# Patient Record
Sex: Male | Born: 1985 | Hispanic: No | Marital: Married | Smoking: Current every day smoker
Health system: Southern US, Community
[De-identification: ages and names within clinical notes are randomized; demographics above are authoritative.]

## PROBLEM LIST (undated history)

## (undated) DIAGNOSIS — F419 Anxiety disorder, unspecified: Secondary | ICD-10-CM

## (undated) DIAGNOSIS — I10 Essential (primary) hypertension: Secondary | ICD-10-CM

## (undated) HISTORY — PX: WISDOM TOOTH EXTRACTION: SHX21

## (undated) HISTORY — PX: TONSILLECTOMY: SUR1361

---
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
## 2007-09-30 ENCOUNTER — Emergency Department (HOSPITAL_COMMUNITY): Admission: EM | Admit: 2007-09-30 | Discharge: 2007-09-30 | Payer: Self-pay | Admitting: Emergency Medicine

## 2007-12-03 ENCOUNTER — Emergency Department (HOSPITAL_COMMUNITY): Admission: EM | Admit: 2007-12-03 | Discharge: 2007-12-03 | Payer: Self-pay | Admitting: Family Medicine

## 2008-03-15 ENCOUNTER — Emergency Department (HOSPITAL_COMMUNITY): Admission: EM | Admit: 2008-03-15 | Discharge: 2008-03-15 | Payer: Self-pay | Admitting: Emergency Medicine

## 2008-08-02 ENCOUNTER — Emergency Department (HOSPITAL_COMMUNITY): Admission: EM | Admit: 2008-08-02 | Discharge: 2008-08-02 | Payer: Self-pay | Admitting: Emergency Medicine

## 2008-09-01 ENCOUNTER — Emergency Department (HOSPITAL_COMMUNITY): Admission: EM | Admit: 2008-09-01 | Discharge: 2008-09-01 | Payer: Self-pay | Admitting: Emergency Medicine

## 2008-09-15 ENCOUNTER — Emergency Department (HOSPITAL_COMMUNITY): Admission: EM | Admit: 2008-09-15 | Discharge: 2008-09-15 | Payer: Self-pay | Admitting: Emergency Medicine

## 2008-09-19 ENCOUNTER — Emergency Department (HOSPITAL_COMMUNITY): Admission: EM | Admit: 2008-09-19 | Discharge: 2008-09-19 | Payer: Self-pay | Admitting: Emergency Medicine

## 2008-11-02 ENCOUNTER — Emergency Department (HOSPITAL_COMMUNITY): Admission: EM | Admit: 2008-11-02 | Discharge: 2008-11-02 | Payer: Self-pay | Admitting: Emergency Medicine

## 2008-12-01 ENCOUNTER — Emergency Department (HOSPITAL_COMMUNITY): Admission: EM | Admit: 2008-12-01 | Discharge: 2008-12-01 | Payer: Self-pay | Admitting: Emergency Medicine

## 2009-01-02 ENCOUNTER — Emergency Department (HOSPITAL_COMMUNITY): Admission: EM | Admit: 2009-01-02 | Discharge: 2009-01-02 | Payer: Self-pay | Admitting: Emergency Medicine

## 2009-02-02 ENCOUNTER — Emergency Department (HOSPITAL_COMMUNITY): Admission: EM | Admit: 2009-02-02 | Discharge: 2009-02-02 | Payer: Self-pay | Admitting: Emergency Medicine

## 2009-02-07 ENCOUNTER — Emergency Department (HOSPITAL_COMMUNITY): Admission: EM | Admit: 2009-02-07 | Discharge: 2009-02-07 | Payer: Self-pay | Admitting: Emergency Medicine

## 2009-02-19 ENCOUNTER — Emergency Department (HOSPITAL_COMMUNITY): Admission: EM | Admit: 2009-02-19 | Discharge: 2009-02-20 | Payer: Self-pay | Admitting: Emergency Medicine

## 2009-02-27 ENCOUNTER — Emergency Department (HOSPITAL_COMMUNITY): Admission: EM | Admit: 2009-02-27 | Discharge: 2009-02-27 | Payer: Self-pay | Admitting: Emergency Medicine

## 2009-04-19 ENCOUNTER — Emergency Department (HOSPITAL_COMMUNITY): Admission: EM | Admit: 2009-04-19 | Discharge: 2009-04-19 | Payer: Self-pay | Admitting: Emergency Medicine

## 2009-05-02 ENCOUNTER — Emergency Department (HOSPITAL_COMMUNITY): Admission: EM | Admit: 2009-05-02 | Discharge: 2009-05-02 | Payer: Self-pay | Admitting: Emergency Medicine

## 2009-05-04 ENCOUNTER — Encounter: Payer: Self-pay | Admitting: *Deleted

## 2009-09-10 ENCOUNTER — Emergency Department (HOSPITAL_COMMUNITY): Admission: EM | Admit: 2009-09-10 | Discharge: 2009-09-11 | Payer: Self-pay | Admitting: Emergency Medicine

## 2009-09-15 ENCOUNTER — Emergency Department (HOSPITAL_COMMUNITY): Admission: EM | Admit: 2009-09-15 | Discharge: 2009-09-16 | Payer: Self-pay | Admitting: Emergency Medicine

## 2009-09-20 ENCOUNTER — Emergency Department (HOSPITAL_COMMUNITY): Admission: EM | Admit: 2009-09-20 | Discharge: 2009-09-20 | Payer: Self-pay | Admitting: Emergency Medicine

## 2010-02-18 ENCOUNTER — Encounter: Payer: Self-pay | Admitting: Orthopaedic Surgery

## 2010-02-27 NOTE — Miscellaneous (Signed)
Summary: Do Not Reschedule  Pt missed NP appt.  Per [facility] policy is not allowed to reschedule. Dennison Nancy RN  May 04, 2009 5:04 PM

## 2010-04-13 LAB — URINALYSIS, ROUTINE W REFLEX MICROSCOPIC
Bilirubin Urine: NEGATIVE
Hgb urine dipstick: NEGATIVE
Ketones, ur: NEGATIVE mg/dL
Nitrite: NEGATIVE
pH: 6 (ref 5.0–8.0)

## 2010-04-13 LAB — CBC
HCT: 51.7 % (ref 39.0–52.0)
MCHC: 33.6 g/dL (ref 30.0–36.0)
MCV: 83.6 fL (ref 78.0–100.0)
RDW: 14 % (ref 11.5–15.5)

## 2010-04-13 LAB — BASIC METABOLIC PANEL
BUN: 15 mg/dL (ref 6–23)
Chloride: 111 mEq/L (ref 96–112)
Glucose, Bld: 91 mg/dL (ref 70–99)
Potassium: 4.5 mEq/L (ref 3.5–5.1)

## 2010-04-13 LAB — DIFFERENTIAL
Basophils Absolute: 0 10*3/uL (ref 0.0–0.1)
Basophils Relative: 1 % (ref 0–1)
Eosinophils Relative: 3 % (ref 0–5)
Lymphocytes Relative: 17 % (ref 12–46)
Monocytes Absolute: 0.4 10*3/uL (ref 0.1–1.0)

## 2010-10-31 LAB — POCT I-STAT, CHEM 8
BUN: 13
Chloride: 108
Creatinine, Ser: 1.1
Glucose, Bld: 103 — ABNORMAL HIGH
Hemoglobin: 16.7
Potassium: 3.6

## 2010-11-08 LAB — POCT CARDIAC MARKERS: Operator id: 4531

## 2010-11-08 LAB — BASIC METABOLIC PANEL
BUN: 10
CO2: 23
Chloride: 107
Glucose, Bld: 121 — ABNORMAL HIGH
Potassium: 4.1

## 2010-11-08 LAB — CBC
HCT: 48
Hemoglobin: 16.1
MCHC: 33.7
MCV: 80.9
Platelets: 255
RDW: 12.9

## 2010-11-08 LAB — DIFFERENTIAL
Basophils Absolute: 0.1
Eosinophils Absolute: 0
Eosinophils Relative: 1
Monocytes Absolute: 0.4

## 2011-03-29 ENCOUNTER — Emergency Department (HOSPITAL_COMMUNITY)
Admission: EM | Admit: 2011-03-29 | Discharge: 2011-03-29 | Disposition: A | Discharge status: Home / Self Care | Payer: Self-pay | Attending: Emergency Medicine | Admitting: Emergency Medicine

## 2011-03-29 ENCOUNTER — Encounter (HOSPITAL_COMMUNITY): Payer: Self-pay | Admitting: Emergency Medicine

## 2011-03-29 DIAGNOSIS — K089 Disorder of teeth and supporting structures, unspecified: Secondary | ICD-10-CM | POA: Insufficient documentation

## 2011-03-29 DIAGNOSIS — I10 Essential (primary) hypertension: Secondary | ICD-10-CM | POA: Insufficient documentation

## 2011-03-29 DIAGNOSIS — F17200 Nicotine dependence, unspecified, uncomplicated: Secondary | ICD-10-CM | POA: Insufficient documentation

## 2011-03-29 DIAGNOSIS — K0889 Other specified disorders of teeth and supporting structures: Secondary | ICD-10-CM

## 2011-03-29 HISTORY — DX: Essential (primary) hypertension: I10

## 2011-03-29 MED ORDER — PENICILLIN V POTASSIUM 500 MG PO TABS: 500.0000 mg | ORAL_TABLET | Freq: Four times a day (QID) | ORAL | Status: AC

## 2011-03-29 MED ORDER — OXYCODONE-ACETAMINOPHEN 5-325 MG PO TABS
2.0000 | ORAL_TABLET | Freq: Once | ORAL | Status: AC
  Administered 2011-03-29: 2 via ORAL
  Filled 2011-03-29 (×2): qty 1

## 2011-03-29 MED ORDER — OXYCODONE-ACETAMINOPHEN 5-325 MG PO TABS: 1.0000 | ORAL_TABLET | Freq: Four times a day (QID) | ORAL | Status: AC | PRN

## 2011-03-29 NOTE — Discharge Instructions (Signed)

## 2011-03-29 NOTE — ED Notes (Signed)
Pt denies any injury. Reports woke up this morning with right-sided facial pain

## 2011-03-29 NOTE — ED Provider Notes (Signed)
History     CSN: [id number]  Arrival date & time 03/29/11  1339   First MD Initiated Contact with Patient 03/29/11 1433      Chief Complaint  Patient presents with  . Dental Pain    r/side facial pain  . Anxiety    due to pain    (Consider location/radiation/quality/duration/timing/severity/associated sxs/prior treatment) HPI  Patient presents to the emergency department with a dental complaint. Symptoms began 2 days ago. The patient has tried to alleviate pain with OTC meds.  Pain rated at a 10/10, characterized as throbbing in nature and located back upper molars. Patient denies fever, night sweats, chills, difficulty swallowing or opening mouth, SOB, nuchal rigidity or decreased ROM of neck.  Patient does not have a dentist and requests a resource guide at discharge. Pt is concerned that it may be his wisdom teeth coming in.    Past Medical History  Diagnosis Date  . Hypertension     Past Surgical History  Procedure Date  . Tonsillectomy     Family History  Problem Relation Age of Onset  . Hypertension Mother     History  Substance Use Topics  . Smoking status: Current Everyday Smoker    Types: Cigarettes  . Smokeless tobacco: Not on file  . Alcohol Use: Yes      Review of Systems  All other systems reviewed and are negative.    Allergies  Tramadol and Vicodin  Home Medications   Current Outpatient Rx  Name Route Sig Dispense Refill  . ACETAMINOPHEN 325 MG PO TABS Oral Take 650 mg by mouth every 6 (six) hours as needed. For pain    . OXYCODONE-ACETAMINOPHEN 5-325 MG PO TABS Oral Take 1 tablet by mouth every 6 (six) hours as needed for pain. 15 tablet 0  . PENICILLIN V POTASSIUM 500 MG PO TABS Oral Take 1 tablet (500 mg total) by mouth 4 (four) times daily. 40 tablet 0    BP 151/98  Pulse 88  Temp(Src) 99.4 F (37.4 C) (Oral)  Resp 16  SpO2 99%  Physical Exam  Nursing note and vitals reviewed. Constitutional: He appears well-developed and  well-nourished.  HENT:  Head: Normocephalic and atraumatic. No trismus in the jaw.  Mouth/Throat: Uvula is midline, oropharynx is clear and moist and mucous membranes are normal. No oral lesions. Dental caries present. No dental abscesses, uvula swelling or lacerations.    Eyes: Conjunctivae and EOM are normal. Pupils are equal, round, and reactive to light.  Neck: Normal range of motion. Neck supple.  Cardiovascular: Normal rate and regular rhythm.   Pulmonary/Chest: Effort normal and breath sounds normal.    ED Course  Procedures (including critical care time)  Labs Reviewed - No data to display No results found.   1. Pain, dental       MDM  PT states that he does not have insurance to see a dentist. Pt lookedup on drug data base and has no history of receiving narcotics in the past year. Pt given penicillin and 12 tabs of Percocet. Pt also given resource guide.        Dorthula Matas, PA 03/29/11 1457

## 2011-03-29 NOTE — ED Notes (Signed)
Pt reports pain in r/upper side of mouth x 2 days

## 2011-03-31 NOTE — ED Provider Notes (Signed)
Medical screening examination/treatment/procedure(s) were performed by non-physician practitioner and as supervising physician I was immediately available for consultation/collaboration.   Hurman Horn, MD 03/31/11 [phone]

## 2011-04-03 ENCOUNTER — Emergency Department (HOSPITAL_COMMUNITY)
Admission: EM | Admit: 2011-04-03 | Discharge: 2011-04-03 | Disposition: A | Discharge status: Home / Self Care | Payer: Self-pay | Attending: Emergency Medicine | Admitting: Emergency Medicine

## 2011-04-03 ENCOUNTER — Encounter (HOSPITAL_COMMUNITY): Payer: Self-pay | Admitting: *Deleted

## 2011-04-03 DIAGNOSIS — I10 Essential (primary) hypertension: Secondary | ICD-10-CM | POA: Insufficient documentation

## 2011-04-03 DIAGNOSIS — K029 Dental caries, unspecified: Secondary | ICD-10-CM | POA: Insufficient documentation

## 2011-04-03 DIAGNOSIS — F17200 Nicotine dependence, unspecified, uncomplicated: Secondary | ICD-10-CM | POA: Insufficient documentation

## 2011-04-03 DIAGNOSIS — K0889 Other specified disorders of teeth and supporting structures: Secondary | ICD-10-CM

## 2011-04-03 MED ORDER — OXYCODONE-ACETAMINOPHEN 5-325 MG PO TABS: 1.0000 | ORAL_TABLET | ORAL | Status: AC | PRN

## 2011-04-03 MED ORDER — OXYCODONE-ACETAMINOPHEN 5-325 MG PO TABS
1.0000 | ORAL_TABLET | Freq: Once | ORAL | Status: AC
  Administered 2011-04-03: 1 via ORAL
  Filled 2011-04-03: qty 1

## 2011-04-03 NOTE — ED Provider Notes (Signed)
History     CSN: [id number]  Arrival date & time 04/03/11  1700   First MD Initiated Contact with Patient 04/03/11 1755      Chief Complaint  Patient presents with  . Dental Pain    (Consider location/radiation/quality/duration/timing/severity/associated sxs/prior treatment) HPI History is obtained from the patient. He presents with dental pain. He was seen here for the same on March 1 and given referrals to dentistry to have the teeth extracted. He was also started on penicillin. He states he still has several pills left of this to take. He states that he was not given the correct contact information for the dentist/oral surgeon on call, and was unable to secure followup within 48 hours in order to have financial help with the visit. He presents requesting another referral; states he has had consistent pain since previous visit. He has taken the Percocet which helped, but he is now out. Pain located to back upper right molars and is throbbing in nature. He denies fevers, chills, difficulty swallowing or opening his mouth. He has had no shortness of breath, chest pain, nuchal rigidity or trouble moving his neck. He has not noticed any facial swelling.  Past Medical History  Diagnosis Date  . Hypertension     Past Surgical History  Procedure Date  . Tonsillectomy     Family History  Problem Relation Age of Onset  . Hypertension Mother     History  Substance Use Topics  . Smoking status: Current Everyday Smoker    Types: Cigarettes  . Smokeless tobacco: Not on file  . Alcohol Use: Yes      Review of Systems  Constitutional: Negative for fever, chills and appetite change.  HENT: Positive for dental problem. Negative for sore throat, facial swelling, drooling, trouble swallowing, neck pain, neck stiffness and voice change.   Respiratory: Negative for shortness of breath.   Cardiovascular: Negative for chest pain.  Skin: Negative for color change.  Neurological: Negative  for headaches.    Allergies  Tramadol and Vicodin  Home Medications   Current Outpatient Rx  Name Route Sig Dispense Refill  . ACETAMINOPHEN 325 MG PO TABS Oral Take 650 mg by mouth every 6 (six) hours as needed. For pain    . OXYCODONE-ACETAMINOPHEN 5-325 MG PO TABS Oral Take 1 tablet by mouth every 6 (six) hours as needed for pain. 15 tablet 0  . PENICILLIN V POTASSIUM 500 MG PO TABS Oral Take 1 tablet (500 mg total) by mouth 4 (four) times daily. 40 tablet 0    BP 160/103  Pulse 95  Temp 98 F (36.7 C)  Resp 20  SpO2 100%  Physical Exam  Nursing note and vitals reviewed. Constitutional: He is oriented to person, place, and time. He appears well-developed and well-nourished. No distress.  HENT:  Head: Normocephalic and atraumatic. No trismus in the jaw.  Left Ear: External ear normal.  Mouth/Throat: Oropharynx is clear and moist and mucous membranes are normal. Dental caries present. No dental abscesses or uvula swelling.         No facial swelling or tenderness to palpation. There is no trismus or tenderness to palpation of the floor of mouth. Tender to palpation over right upper molars without obvious broken teeth. Dental caries noted.  Eyes: EOM are normal. Pupils are equal, round, and reactive to light.  Neck: Normal range of motion.       No neck soft tissue swelling noted  Cardiovascular: Normal rate.   Pulmonary/Chest: Effort  normal.  Neurological: He is alert and oriented to person, place, and time.  Skin: Skin is warm and dry. He is not diaphoretic.  Psychiatric: He has a normal mood and affect.    ED Course  Procedures (including critical care time)  Labs Reviewed - No data to display No results found.   1. Pain, dental       MDM  Patient with dental pain since last week, who was unable to secure followup with dentistry. No obvious evidence of abscess today on exam. He is still finishing a course of penicillin. He was given resources to make a  followup appointment with the dental staff on call. I agreed to write him another prescription for analgesics, but stated that it is important that he has his teeth assessed by a dentist. Return precautions discussed.        Grant Fontana, [place] 04/03/11 Windell Moment

## 2011-04-03 NOTE — ED Notes (Signed)
Pt in c/o toothache since last Thursday, seen here for same last week

## 2011-04-03 NOTE — Discharge Instructions (Signed)
It is important that you make a followup with the oral surgeon to have your tooth pulled. Please see the attached contact information for the oral surgeon on call. If you have facial swelling or worsening condition or fever, please return to the ED.  Dental Pain A tooth ache may be caused by cavities (tooth decay). Cavities expose the nerve of the tooth to air and hot or cold temperatures. It may come from an infection or abscess (also called a boil or furuncle) around your tooth. It is also often caused by dental caries (tooth decay). This causes the pain you are having. DIAGNOSIS  Your caregiver can diagnose this problem by exam. TREATMENT   If caused by an infection, it may be treated with medications which kill germs (antibiotics) and pain medications as prescribed by your caregiver. Take medications as directed.   Only take over-the-counter or prescription medicines for pain, discomfort, or fever as directed by your caregiver.   Whether the tooth ache today is caused by infection or dental disease, you should see your dentist as soon as possible for further care.  SEEK MEDICAL CARE IF: The exam and treatment you received today has been provided on an emergency basis only. This is not a substitute for complete medical or dental care. If your problem worsens or new problems (symptoms) appear, and you are unable to meet with your dentist, call or return to this location. SEEK IMMEDIATE MEDICAL CARE IF:   You have a fever.   You develop redness and swelling of your face, jaw, or neck.   You are unable to open your mouth.   You have severe pain uncontrolled by pain medicine.  MAKE SURE YOU:   Understand these instructions.   Will watch your condition.   Will get help right away if you are not doing well or get worse.  Document Released: 01/14/2005 Document Revised: 01/03/2011 Document Reviewed: 09/02/2007 [facility] Patient Information 2012 [place], [place].

## 2011-04-06 NOTE — ED Provider Notes (Signed)
Medical screening examination/treatment/procedure(s) were performed by non-physician practitioner and as supervising physician I was immediately available for consultation/collaboration.  Cyndra Numbers, MD 04/06/11 [phone]

## 2011-07-15 ENCOUNTER — Emergency Department (HOSPITAL_COMMUNITY)
Admission: EM | Admit: 2011-07-15 | Discharge: 2011-07-15 | Disposition: A | Discharge status: Home / Self Care | Payer: Self-pay | Attending: Emergency Medicine | Admitting: Emergency Medicine

## 2011-07-15 ENCOUNTER — Encounter (HOSPITAL_COMMUNITY): Payer: Self-pay | Admitting: *Deleted

## 2011-07-15 DIAGNOSIS — K0889 Other specified disorders of teeth and supporting structures: Secondary | ICD-10-CM

## 2011-07-15 DIAGNOSIS — K089 Disorder of teeth and supporting structures, unspecified: Secondary | ICD-10-CM | POA: Insufficient documentation

## 2011-07-15 DIAGNOSIS — K0381 Cracked tooth: Secondary | ICD-10-CM | POA: Insufficient documentation

## 2011-07-15 DIAGNOSIS — Z8249 Family history of ischemic heart disease and other diseases of the circulatory system: Secondary | ICD-10-CM | POA: Insufficient documentation

## 2011-07-15 DIAGNOSIS — F17200 Nicotine dependence, unspecified, uncomplicated: Secondary | ICD-10-CM | POA: Insufficient documentation

## 2011-07-15 DIAGNOSIS — I10 Essential (primary) hypertension: Secondary | ICD-10-CM | POA: Insufficient documentation

## 2011-07-15 MED ORDER — OXYCODONE-ACETAMINOPHEN 5-325 MG PO TABS: 1.0000 | ORAL_TABLET | ORAL | Status: DC | PRN

## 2011-07-15 MED ORDER — AMOXICILLIN 500 MG PO CAPS
1000.0000 mg | ORAL_CAPSULE | Freq: Once | ORAL | Status: AC
  Administered 2011-07-15: 1000 mg via ORAL
  Filled 2011-07-15: qty 2

## 2011-07-15 MED ORDER — OXYCODONE-ACETAMINOPHEN 5-325 MG PO TABS
1.0000 | ORAL_TABLET | Freq: Once | ORAL | Status: AC
  Administered 2011-07-15: 1 via ORAL
  Filled 2011-07-15: qty 1

## 2011-07-15 MED ORDER — AMOXICILLIN 500 MG PO CAPS: 1000.0000 mg | ORAL_CAPSULE | Freq: Two times a day (BID) | ORAL | Status: DC

## 2011-07-15 NOTE — ED Notes (Signed)
Toothache for 3 days that is causing his head to hurt.

## 2011-07-15 NOTE — Discharge Instructions (Signed)
Called a Patent examiner. Tell them you were referred from the emergency department. The dentist is supposed to take care of the immediate problem.  Dental Pain A tooth ache may be caused by cavities (tooth decay). Cavities expose the nerve of the tooth to air and hot or cold temperatures. It may come from an infection or abscess (also called a boil or furuncle) around your tooth. It is also often caused by dental caries (tooth decay). This causes the pain you are having. DIAGNOSIS  Your caregiver can diagnose this problem by exam. TREATMENT   If caused by an infection, it may be treated with medications which kill germs (antibiotics) and pain medications as prescribed by your caregiver. Take medications as directed.   Only take over-the-counter or prescription medicines for pain, discomfort, or fever as directed by your caregiver.   Whether the tooth ache today is caused by infection or dental disease, you should see your dentist as soon as possible for further care.  SEEK MEDICAL CARE IF: The exam and treatment you received today has been provided on an emergency basis only. This is not a substitute for complete medical or dental care. If your problem worsens or new problems (symptoms) appear, and you are unable to meet with your dentist, call or return to this location. SEEK IMMEDIATE MEDICAL CARE IF:   You have a fever.   You develop redness and swelling of your face, jaw, or neck.   You are unable to open your mouth.   You have severe pain uncontrolled by pain medicine.  MAKE SURE YOU:   Understand these instructions.   Will watch your condition.   Will get help right away if you are not doing well or get worse.  Document Released: 01/14/2005 Document Revised: 01/03/2011 Document Reviewed: 09/02/2007 [facility] Patient Information 2012 [place], [place].  Smoking Hazards Smoking cigarettes is extremely bad for your health. Tobacco smoke has over 200 known poisons in it. There are  over 60 chemicals in tobacco smoke that cause cancer. Some of the chemicals found in cigarette smoke include:   Cyanide.   Benzene.   Formaldehyde.   Methanol (wood alcohol).   Acetylene (fuel used in welding torches).   Ammonia.  Cigarette smoke also contains the poisonous gases nitrogen oxide and carbon monoxide.  Cigarette smokers have an increased risk of many serious medical problems, including:  Lung cancer.   Lung disease (such as pneumonia, bronchitis, and emphysema).   Heart attack and chest pain due to the heart not getting enough oxygen (angina).   Heart disease and peripheral blood vessel disease.   Hypertension.   Stroke.   Oral cancer (cancer of the lip, mouth, or voice box).   Bladder cancer.   Pancreatic cancer.   Cervical cancer.   Pregnancy complications, including premature birth.   Low birthweight babies.   Early menopause.   Lower estrogen level for women.   Infertility.   Facial wrinkles.   Blindness.   Increased risk of broken bones (fractures).   Senile dementia.   Stillbirths and smaller newborn babies, birth defects, and genetic damage to sperm.   Stomach ulcers and internal bleeding.  Children of smokers have an increased risk of the following, because of secondhand smoke exposure:   Sudden infant death syndrome (SIDS).   Respiratory infections.   Lung cancer.   Heart disease.   Ear infections.  Smoking causes approximately:  90% of all lung cancer deaths in men.   80% of all lung cancer deaths  in women.   90% of deaths from chronic obstructive lung disease.  Compared with nonsmokers, smoking increases the risk of:  Coronary heart disease by 2 to 4 times.   Stroke by 2 to 4 times.   Men developing lung cancer by 23 times.   Women developing lung cancer by 13 times.   Dying from chronic obstructive lung diseases by 12 times.  Someone who smokes 2 packs a day loses about 8 years of his or her life. Even  smoking lightly shortens your life expectancy by several years. You can greatly reduce the risk of medical problems for you and your family by stopping now. Smoking is the most preventable cause of death and disease in our society. Within days of quitting smoking, your circulation returns to normal, you decrease the risk of having a heart attack, and your lung capacity improves. There may be some increased phlegm in the first few days after quitting, and it may take months for your lungs to clear up completely. Quitting for 10 years cuts your lung cancer risk to almost that of a nonsmoker. WHY IS SMOKING ADDICTIVE?  Nicotine is the chemical agent in tobacco that is capable of causing addiction or dependence.   When you smoke and inhale, nicotine is absorbed rapidly into the bloodstream through your lungs. Nicotine absorbed through the lungs is capable of creating a powerful addiction. Both inhaled and non-inhaled nicotine may be addictive.   Addiction studies of cigarettes and spit tobacco show that addiction to nicotine occurs mainly during the teen years, when young people begin using tobacco products.  WHAT ARE THE BENEFITS OF QUITTING?  There are many health benefits to quitting smoking.   Likelihood of developing cancer and heart disease decreases. Health improvements are seen almost immediately.   Blood pressure, pulse rate, and breathing patterns start returning to normal soon after quitting.   People who quit may see an improvement in their overall quality of life.  Some people choose to quit all at once. Other options include nicotine replacement products, such as patches, gum, and nasal sprays. Do not use these products without first checking with your caregiver. QUITTING SMOKING It is not easy to quit smoking. Nicotine is addicting, and longtime habits are hard to change. To start, you can write down all your reasons for quitting, tell your family and friends you want to quit, and ask  for their help. Throw your cigarettes away, chew gum or cinnamon sticks, keep your hands busy, and drink extra water or juice. Go for walks and practice deep breathing to relax. Think of all the money you are saving: around $1,000 a year, for the average pack-a-day smoker. Nicotine patches and gum have been shown to improve success at efforts to stop smoking. Zyban (bupropion) is an anti-depressant drug that can be prescribed to reduce nicotine withdrawal symptoms and to suppress the urge to smoke. Smoking is an addiction with both physical and psychological effects. Joining a stop-smoking support group can help you cope with the emotional issues. For more information and advice on programs to stop smoking, call your doctor, your local hospital, or these organizations:  American Lung Association - 1-800-LUNGUSA   American Cancer Society - 1-800-ACS-2345  Document Released: 02/22/2004 Document Revised: 01/03/2011 Document Reviewed: 10/26/2008 [facility] Patient Information 2012 [place], [place].  Amoxicillin capsules or tablets What is this medicine? AMOXICILLIN (a mox i SIL in) is a penicillin antibiotic. It is used to treat certain kinds of bacterial infections. It will not work  for colds, flu, or other viral infections. This medicine may be used for other purposes; ask your health care provider or pharmacist if you have questions. What should I tell my health care provider before I take this medicine? They need to know if you have any of these conditions: -asthma -kidney disease -an unusual or allergic reaction to amoxicillin, other penicillins, cephalosporin antibiotics, other medicines, foods, dyes, or preservatives -pregnant or trying to get pregnant -breast-feeding How should I use this medicine? Take this medicine by mouth with a glass of water. Follow the directions on your prescription label. You may take this medicine with food or on an empty stomach. Take your medicine at regular  intervals. Do not take your medicine more often than directed. Take all of your medicine as directed even if you think your are better. Do not skip doses or stop your medicine early. Talk to your pediatrician regarding the use of this medicine in children. While this drug may be prescribed for selected conditions, precautions do apply. Overdosage: If you think you have taken too much of this medicine contact a poison control center or emergency room at once. NOTE: This medicine is only for you. Do not share this medicine with others. What if I miss a dose? If you miss a dose, take it as soon as you can. If it is almost time for your next dose, take only that dose. Do not take double or extra doses. What may interact with this medicine? -amiloride -birth control pills -chloramphenicol -macrolides -probenecid -sulfonamides -tetracyclines This list may not describe all possible interactions. Give your health care provider a list of all the medicines, herbs, non-prescription drugs, or dietary supplements you use. Also tell them if you smoke, drink alcohol, or use illegal drugs. Some items may interact with your medicine. What should I watch for while using this medicine? Tell your doctor or health care professional if your symptoms do not improve in 2 or 3 days. Take all of the doses of your medicine as directed. Do not skip doses or stop your medicine early. If you are diabetic, you may get a false positive result for sugar in your urine with certain brands of urine tests. Check with your doctor. Do not treat diarrhea with over-the-counter products. Contact your doctor if you have diarrhea that lasts more than 2 days or if the diarrhea is severe and watery. What side effects may I notice from receiving this medicine? Side effects that you should report to your doctor or health care professional as soon as possible: -allergic reactions like skin rash, itching or hives, swelling of the face, lips, or  tongue -breathing problems -dark urine -redness, blistering, peeling or loosening of the skin, including inside the mouth -seizures -severe or watery diarrhea -trouble passing urine or change in the amount of urine -unusual bleeding or bruising -unusually weak or tired -yellowing of the eyes or skin Side effects that usually do not require medical attention (report to your doctor or health care professional if they continue or are bothersome): -dizziness -headache -stomach upset -trouble sleeping This list may not describe all possible side effects. Call your doctor for medical advice about side effects. You may report side effects to FDA at 1-800-FDA-1088. Where should I keep my medicine? Keep out of the reach of children. Store between 68 and 77 degrees F (20 and 25 degrees C). Keep bottle closed tightly. Throw away any unused medicine after the expiration date. NOTE: This sheet is a summary. It may  not cover all possible information. If you have questions about this medicine, talk to your doctor, pharmacist, or health care provider.  2012, [employer]vier/[employer] Standard. (04/07/2007 2:10:59 PM)  Acetaminophen; Oxycodone tablets What is this medicine? ACETAMINOPHEN; OXYCODONE (a set a MEE noe fen; ox i KOE done) is a pain reliever. It is used to treat mild to moderate pain. This medicine may be used for other purposes; ask your health care provider or pharmacist if you have questions. What should I tell my health care provider before I take this medicine? They need to know if you have any of these conditions: -brain tumor -Crohn's disease, inflammatory bowel disease, or ulcerative colitis -drink more than 3 alcohol containing drinks per day -drug abuse or addiction -head injury -heart or circulation problems -kidney disease or problems going to the bathroom -liver disease -lung disease, asthma, or breathing problems -an unusual or allergic reaction to acetaminophen, oxycodone, other  opioid analgesics, other medicines, foods, dyes, or preservatives -pregnant or trying to get pregnant -breast-feeding How should I use this medicine? Take this medicine by mouth with a full glass of water. Follow the directions on the prescription label. Take your medicine at regular intervals. Do not take your medicine more often than directed. Talk to your pediatrician regarding the use of this medicine in children. Special care may be needed. Patients over 22 years old may have a stronger reaction and need a smaller dose. Overdosage: If you think you have taken too much of this medicine contact a poison control center or emergency room at once. NOTE: This medicine is only for you. Do not share this medicine with others. What if I miss a dose? If you miss a dose, take it as soon as you can. If it is almost time for your next dose, take only that dose. Do not take double or extra doses. What may interact with this medicine? -alcohol or medicines that contain alcohol -antihistamines -barbiturates like amobarbital, butalbital, butabarbital, methohexital, pentobarbital, phenobarbital, thiopental, and secobarbital -benztropine -drugs for bladder problems like solifenacin, trospium, oxybutynin, tolterodine, hyoscyamine, and methscopolamine -drugs for breathing problems like ipratropium and tiotropium -drugs for certain stomach or intestine problems like propantheline, homatropine methylbromide, glycopyrrolate, atropine, belladonna, and dicyclomine -general anesthetics like etomidate, ketamine, nitrous oxide, propofol, desflurane, enflurane, halothane, isoflurane, and sevoflurane -medicines for depression, anxiety, or psychotic disturbances -medicines for pain like codeine, morphine, pentazocine, buprenorphine, butorphanol, nalbuphine, tramadol, and propoxyphene -medicines for sleep -muscle relaxants -naltrexone -phenothiazines like perphenazine, thioridazine, chlorpromazine, mesoridazine,  fluphenazine, prochlorperazine, promazine, and trifluoperazine -scopolamine -trihexyphenidyl This list may not describe all possible interactions. Give your health care provider a list of all the medicines, herbs, non-prescription drugs, or dietary supplements you use. Also tell them if you smoke, drink alcohol, or use illegal drugs. Some items may interact with your medicine. What should I watch for while using this medicine? Tell your doctor or health care professional if your pain does not go away, if it gets worse, or if you have new or a different type of pain. You may develop tolerance to the medicine. Tolerance means that you will need a higher dose of the medication for pain relief. Tolerance is normal and is expected if you take this medicine for a long time. Do not suddenly stop taking your medicine because you may develop a severe reaction. Your body becomes used to the medicine. This does NOT mean you are addicted. Addiction is a behavior related to getting and using a drug for a nonmedical reason. If you  have pain, you have a medical reason to take pain medicine. Your doctor will tell you how much medicine to take. If your doctor wants you to stop the medicine, the dose will be slowly lowered over time to avoid any side effects. You may get drowsy or dizzy. Do not drive, use machinery, or do anything that needs mental alertness until you know how this medicine affects you. Do not stand or sit up quickly, especially if you are an older patient. This reduces the risk of dizzy or fainting spells. Alcohol may interfere with the effect of this medicine. Avoid alcoholic drinks. The medicine will cause constipation. Try to have a bowel movement at least every 2 to 3 days. If you do not have a bowel movement for 3 days, call your doctor or health care professional. Do not take Tylenol (acetaminophen) or medicines that have acetaminophen with this medicine. Too much acetaminophen can be very dangerous.  Many nonprescription medicines contain acetaminophen. Always read the labels carefully to avoid taking more acetaminophen. What side effects may I notice from receiving this medicine? Side effects that you should report to your doctor or health care professional as soon as possible: -allergic reactions like skin rash, itching or hives, swelling of the face, lips, or tongue -breathing difficulties, wheezing -confusion -light headedness or fainting spells -severe stomach pain -yellowing of the skin or the whites of the eyes Side effects that usually do not require medical attention (report to your doctor or health care professional if they continue or are bothersome): -dizziness -drowsiness -nausea -vomiting This list may not describe all possible side effects. Call your doctor for medical advice about side effects. You may report side effects to FDA at 1-800-FDA-1088. Where should I keep my medicine? Keep out of the reach of children. This medicine can be abused. Keep your medicine in a safe place to protect it from theft. Do not share this medicine with anyone. Selling or giving away this medicine is dangerous and against the law. Store at room temperature between 20 and 25 degrees C (68 and 77 degrees F). Keep container tightly closed. Protect from light. Flush any unused medicines down the toilet. Do not use the medicine after the expiration date. NOTE: This sheet is a summary. It may not cover all possible information. If you have questions about this medicine, talk to your doctor, pharmacist, or health care provider.  2012, Elsevier/Gold Standard. (12/14/2007 10:01:21 AM)

## 2011-07-15 NOTE — ED Provider Notes (Signed)
History   This chart was scribed for Luis Booze, MD by Luis Wall. The patient was seen in room STRE6/STRE6. Patient's care was started at 1750.     CSN: [id number]  Arrival date & time 07/15/11  1750   First MD Initiated Contact with Patient 07/15/11 1840      Chief Complaint  Patient presents with  . Dental Pain    (Consider location/radiation/quality/duration/timing/severity/associated sxs/prior treatment) Patient is a 26 y.o. male presenting with tooth pain. The history is provided by the patient. No language interpreter was used.  Dental PainThe primary symptoms include mouth pain and headaches. The symptoms began 2 days ago. The symptoms are new. The symptoms occur constantly.  Mouth pain began 24 -48 hours ago. Mouth pain occurs constantly. Affected locations include: teeth. At its highest the mouth pain was at 10/10. The mouth pain is currently at 10/10.  The headache developed gradually. Headache is a new problem.  Medical issues include: smoking.   Luis Wall is a 26 y.o. male who presents to the Emergency Department complaining of bilateral upper dental pain shooting up to his head onset 3 days ago. Patient says the pain is 10/10. Patient says that light worsens the pain. Patient says that hasn't been able to eat because it hurts to chew. Patient denies fever, chills or diaphoresis. Patient with h/o of HTN. Patient with surgical h/o tonsillectomy. Patient says he smokes and drinks occoccasionally. Patient says a pack of cigarettes would last him 5 days.  Patient has no primary care physician.  Past Medical History  Diagnosis Date  . Hypertension     Past Surgical History  Procedure Date  . Tonsillectomy     Family History  Problem Relation Age of Onset  . Hypertension Mother     History  Substance Use Topics  . Smoking status: Current Everyday Smoker    Types: Cigarettes  . Smokeless tobacco: Not on file  . Alcohol Use: Yes      Review of Systems    Neurological: Positive for headaches.   A complete 10 system review of systems was obtained and all systems are negative except as noted in the HPI and PMH.   Allergies  Tramadol and Vicodin  Home Medications   Current Outpatient Rx  Name Route Sig Dispense Refill  . IBUPROFEN 200 MG PO TABS Oral Take 400 mg by mouth every 6 (six) hours as needed. For headaches      BP 143/93  Pulse 80  Temp 98.2 F (36.8 C) (Oral)  Resp 18  SpO2 100%  Physical Exam  Nursing note and vitals reviewed. Constitutional: He is oriented to person, place, and time. He appears well-developed and well-nourished. No distress.  HENT:  Head: Normocephalic and atraumatic.  Mouth/Throat:         Fracture of #16. Tender to percussion. No gingival erythema, palate. No drainage.  Eyes: Conjunctivae and EOM are normal.  Neck: Neck supple.  Cardiovascular: Normal rate.   Pulmonary/Chest: Effort normal.  Musculoskeletal: Normal range of motion.  Lymphadenopathy:    He has no cervical adenopathy.  Neurological: He is alert and oriented to person, place, and time. No sensory deficit.  Skin: Skin is dry.  Psychiatric: He has a normal mood and affect. His behavior is normal.    ED Course  Procedures (including critical care time) DIAGNOSTIC STUDIES: Oxygen Saturation is 100% on room air, normal by my interpretation.    COORDINATION OF CARE: 6:45PM- Patient informed of current plan for treatment  and evaluation and agrees with plan at this time. Informed patient of on-call dentist that he can contact with 48 hours of ED visit. Will provide referral to dentist. Will prescribe antibiotic and pain medication.   1. Pain, dental       MDM  Dental pain. He is complaining of pain across virtually all of his teeth. The only tooth that clearly shows pathology is tooth #16 which has a fracture. Old records are reviewed and he had to ED visits where he was referred to a dentist but failed to follow through  appropriately. He is advised of the need to call the dentist within 2 days and to the to know that he was referred from the ED. Prescriptions are given for amoxicillin and Percocet.      I personally performed the services described in this documentation, which was scribed in my presence. The recorded information has been reviewed and considered.      Luis Booze, MD 07/15/11 2028

## 2011-07-17 ENCOUNTER — Encounter (HOSPITAL_COMMUNITY): Payer: Self-pay | Admitting: Emergency Medicine

## 2011-07-17 ENCOUNTER — Emergency Department (HOSPITAL_COMMUNITY)
Admission: EM | Admit: 2011-07-17 | Discharge: 2011-07-17 | Disposition: A | Discharge status: Home / Self Care | Payer: Self-pay | Attending: Emergency Medicine | Admitting: Emergency Medicine

## 2011-07-17 DIAGNOSIS — K089 Disorder of teeth and supporting structures, unspecified: Secondary | ICD-10-CM | POA: Insufficient documentation

## 2011-07-17 DIAGNOSIS — K0889 Other specified disorders of teeth and supporting structures: Secondary | ICD-10-CM

## 2011-07-17 DIAGNOSIS — I10 Essential (primary) hypertension: Secondary | ICD-10-CM | POA: Insufficient documentation

## 2011-07-17 DIAGNOSIS — F17200 Nicotine dependence, unspecified, uncomplicated: Secondary | ICD-10-CM | POA: Insufficient documentation

## 2011-07-17 NOTE — ED Provider Notes (Signed)
History     CSN: [id number]  Arrival date & time 07/17/11  1256   First MD Initiated Contact with Patient 07/17/11 1406      Chief Complaint  Patient presents with  . Dental Pain    (Consider location/radiation/quality/duration/timing/severity/associated sxs/prior treatment) HPI  26 year old male who was seen in the ED 2 days ago for dental pain and was given a referral to dentist. He has followup with Dr. Ninetta Lights yesterday and had his left upper wisdom tooth removed. Patient states he went home and now is having pain to his right upper wisdom to. Describe pain as a sharp and throbbing sensation that is constant. Pain is nonradiating. He denies fever, hearing changes, neck pain, chest pain, shortness of breath, or rash. Patient states he was also given amoxicillin but that has made his stomach upset. Patient also mentioned that he has ran out of his pain medication. Patient has not follow up with Dr. Ninetta Lights for this new pain.  Past Medical History  Diagnosis Date  . Hypertension     Past Surgical History  Procedure Date  . Tonsillectomy   . Wisdom tooth extraction     Family History  Problem Relation Age of Onset  . Hypertension Mother     History  Substance Use Topics  . Smoking status: Current Everyday Smoker    Types: Cigarettes  . Smokeless tobacco: Not on file  . Alcohol Use: Yes      Review of Systems  Constitutional: Negative for fever.  HENT: Positive for mouth sores. Negative for neck pain.   Skin: Negative for rash.  Neurological: Negative for headaches.    Allergies  Tramadol and Vicodin  Home Medications   Current Outpatient Rx  Name Route Sig Dispense Refill  . ACETAMINOPHEN 500 MG PO TABS Oral Take 1,500 mg by mouth every 6 (six) hours as needed. For pain    . IBUPROFEN 200 MG PO TABS Oral Take 400 mg by mouth every 6 (six) hours as needed. For headaches    . OXYCODONE-ACETAMINOPHEN 5-325 MG PO TABS Oral Take 1 tablet by mouth every 4 (four)  hours as needed for pain. 15 tablet 0    BP 132/86  Pulse 81  Temp 99.2 F (37.3 C) (Oral)  Resp 16  Ht 6\' 2"  (1.88 m)  Wt 280 lb (127.007 kg)  BMI 35.95 kg/m2  SpO2 98%  Physical Exam  Nursing note and vitals reviewed. Constitutional: He is oriented to person, place, and time. He appears well-developed and well-nourished. No distress.  HENT:  Head: Atraumatic.  Mouth/Throat: Uvula is midline, oropharynx is clear and moist and mucous membranes are normal.    Eyes: Conjunctivae are normal.  Neck: Normal range of motion. Neck supple.  Neurological: He is alert and oriented to person, place, and time.  Skin: Skin is warm. No rash noted.  Psychiatric: He has a normal mood and affect.    ED Course  Procedures (including critical care time)  Labs Reviewed - No data to display No results found.   No diagnosis found.    MDM  Recurrent dental pain.  VSS, afebrile.  Was seen by Dr. Ninetta Lights yesterday and has dental extraction.  I recommend return to dentist for further evaluation.  Will not prescribe narcotic pain med today.  Recommend OTC pain meds.          Fayrene Helper, PA-C 07/17/11 1425

## 2011-07-17 NOTE — ED Notes (Signed)
Pt c/o right upper wisdom toothache that started yesterday after having another wisdom tooth removed.  Pt reports taking Amoxicillin and Vicodin at home since Monday.  Pt stopped taking Amoxicillin due to GI upset.

## 2011-07-17 NOTE — Discharge Instructions (Signed)
Please follow up with Dr. Ninetta Lights for reevaluation for your dental pain.  You may take over the counter tylenol for pain.  Avoid taking more than 3grams of acetaminophen per day as it can harm your liver.   Dental Pain A tooth ache may be caused by cavities (tooth decay). Cavities expose the nerve of the tooth to air and hot or cold temperatures. It may come from an infection or abscess (also called a boil or furuncle) around your tooth. It is also often caused by dental caries (tooth decay). This causes the pain you are having. DIAGNOSIS  Your caregiver can diagnose this problem by exam. TREATMENT   If caused by an infection, it may be treated with medications which kill germs (antibiotics) and pain medications as prescribed by your caregiver. Take medications as directed.   Only take over-the-counter or prescription medicines for pain, discomfort, or fever as directed by your caregiver.   Whether the tooth ache today is caused by infection or dental disease, you should see your dentist as soon as possible for further care.  SEEK MEDICAL CARE IF: The exam and treatment you received today has been provided on an emergency basis only. This is not a substitute for complete medical or dental care. If your problem worsens or new problems (symptoms) appear, and you are unable to meet with your dentist, call or return to this location. SEEK IMMEDIATE MEDICAL CARE IF:   You have a fever.   You develop redness and swelling of your face, jaw, or neck.   You are unable to open your mouth.   You have severe pain uncontrolled by pain medicine.  MAKE SURE YOU:   Understand these instructions.   Will watch your condition.   Will get help right away if you are not doing well or get worse.  Document Released: 01/14/2005 Document Revised: 01/03/2011 Document Reviewed: 09/02/2007 [facility] Patient Information 2012 [place], [place].

## 2011-07-18 NOTE — ED Provider Notes (Signed)
Medical screening examination/treatment/procedure(s) were performed by non-physician practitioner and as supervising physician I was immediately available for consultation/collaboration.   Rayaan Garguilo M Berlynn Warsame, MD 07/18/11 1333 

## 2011-07-22 ENCOUNTER — Encounter (HOSPITAL_COMMUNITY): Payer: Self-pay | Admitting: *Deleted

## 2011-07-22 ENCOUNTER — Emergency Department (HOSPITAL_COMMUNITY)
Admission: EM | Admit: 2011-07-22 | Discharge: 2011-07-22 | Disposition: A | Discharge status: Home / Self Care | Payer: Self-pay | Attending: Emergency Medicine | Admitting: Emergency Medicine

## 2011-07-22 DIAGNOSIS — Z885 Allergy status to narcotic agent status: Secondary | ICD-10-CM | POA: Insufficient documentation

## 2011-07-22 DIAGNOSIS — K089 Disorder of teeth and supporting structures, unspecified: Secondary | ICD-10-CM | POA: Insufficient documentation

## 2011-07-22 DIAGNOSIS — I10 Essential (primary) hypertension: Secondary | ICD-10-CM | POA: Insufficient documentation

## 2011-07-22 DIAGNOSIS — K0889 Other specified disorders of teeth and supporting structures: Secondary | ICD-10-CM

## 2011-07-22 DIAGNOSIS — F1010 Alcohol abuse, uncomplicated: Secondary | ICD-10-CM | POA: Insufficient documentation

## 2011-07-22 DIAGNOSIS — F17200 Nicotine dependence, unspecified, uncomplicated: Secondary | ICD-10-CM | POA: Insufficient documentation

## 2011-07-22 MED ORDER — IBUPROFEN 600 MG PO TABS: 600.0000 mg | ORAL_TABLET | Freq: Four times a day (QID) | ORAL | Status: AC | PRN

## 2011-07-22 MED ORDER — OXYCODONE-ACETAMINOPHEN 5-325 MG PO TABS
1.0000 | ORAL_TABLET | Freq: Once | ORAL | Status: AC
  Administered 2011-07-22: 1 via ORAL
  Filled 2011-07-22: qty 1

## 2011-07-22 MED ORDER — PENICILLIN V POTASSIUM 500 MG PO TABS: 500.0000 mg | ORAL_TABLET | Freq: Four times a day (QID) | ORAL | Status: AC

## 2011-07-22 MED ORDER — OXYCODONE-ACETAMINOPHEN 5-325 MG PO TABS: 2.0000 | ORAL_TABLET | ORAL | Status: AC | PRN

## 2011-07-22 NOTE — Discharge Instructions (Signed)
Dental Pain °A tooth ache may be caused by cavities (tooth decay). Cavities expose the nerve of the tooth to air and hot or cold temperatures. It may come from an infection or abscess (also called a boil or furuncle) around your tooth. It is also often caused by dental caries (tooth decay). This causes the pain you are having. °DIAGNOSIS  °Your caregiver can diagnose this problem by exam. °TREATMENT  °· If caused by an infection, it may be treated with medications which kill germs (antibiotics) and pain medications as prescribed by your caregiver. Take medications as directed.  °· Only take over-the-counter or prescription medicines for pain, discomfort, or fever as directed by your caregiver.  °· Whether the tooth ache today is caused by infection or dental disease, you should see your dentist as soon as possible for further care.  °SEEK MEDICAL CARE IF: °The exam and treatment you received today has been provided on an emergency basis only. This is not a substitute for complete medical or dental care. If your problem worsens or new problems (symptoms) appear, and you are unable to meet with your dentist, call or return to this location. °SEEK IMMEDIATE MEDICAL CARE IF:  °· You have a fever.  °· You develop redness and swelling of your face, jaw, or neck.  °· You are unable to open your mouth.  °· You have severe pain uncontrolled by pain medicine.  °MAKE SURE YOU:  °· Understand these instructions.  °· Will watch your condition.  °· Will get help right away if you are not doing well or get worse.  °Document Released: 01/14/2005 Document Revised: 01/03/2011 Document Reviewed: 09/02/2007 °[employer]® Patient Information ©2012 [employer]. ° °RESOURCE GUIDE ° °Dental Problems ° °Patients with Medicaid: °[facility]                     [facility] °[address]                                           [address] °Phone:  [phone]                                                    Phone:  510-2600 ° °If unable to pay or uninsured, contact:  [facility] or [facility] to become qualified for the adult dental clinic. ° °Chronic Pain Problems °Contact [facility]  [phone] °Patients need to be referred by their primary care doctor. ° °Insufficient Money for Medicine °Contact [employer]:  call "211" or [facility] 271-5999. ° °No Primary Care Doctor °Call [facility]  832-8000 °Other agencies that provide inexpensive medical care °   [facility]  832-8035 °   [facility]  832-7272 °   [facility]  [phone] °   [facility]  [phone] °   [facility]  [phone] °   [facility]  [phone] ° °Psychological Services °[facility]  [phone] °[facility]  [phone] °[facility]   800 853-5163 (emergency services 641-4993) ° °Abuse/Neglect °[place] (336) 641-3795 °[place] [facility] Hotline 800-378-5315 (After Hours) ° °Emergency   Shelter °[facility] (336) 271-5985 ° °Maternity Homes °[facility] (336) 275-9566 °[facility] (704) 372-4663 ° °MRSA Hotline #:   832-7006 ° ° ° °[place] [facility] ° °[facility]  [employer]                           [facility] °[address] [place]                     [address]         [address]  °[place]                                               [place]                              [place] °Phone:  349-3220                                  Phone:  [phone]                   Phone:  342-8140 ° °[facility] °Phone:  342-8316 ° °[place] [facility] Hotline °(336) 342-1394 °(336) 342-3537 (After Hours) ° °

## 2011-07-22 NOTE — ED Notes (Signed)
To ED for eval of continued toothache. States he had a tooth pulled last week and now the tooth beside the extracted tooth is hurting.

## 2011-07-22 NOTE — ED Provider Notes (Signed)
History   This chart was scribed for Forbes Cellar, MD by Toya Smothers. The patient was seen in room TR09C/TR09C. Patient's care was started at 1658.  CSN: [id number]  Arrival date & time 07/22/11  1658   First MD Initiated Contact with Patient 07/22/11 1921      Chief Complaint  Patient presents with  . Dental Pain    Patient is a 26 y.o. male presenting with tooth pain. The history is provided by the patient. No language interpreter was used.  Dental Pain   Luis Wall is a 26 y.o. male who presents to the Emergency Department complaining of gradual onset moderate sever constant tooth pain. Pt reports that he had a tooth removed, and that he believes that they may have aggravated the adjacent tooth. After the procedure Pt was also give Vicodin, of which he is allergic to and has not been taking. Pain 10/10 at this time. Denies fever/chills.  Pt list a medical h/o hypertension. Pt is a current everyday smoker and admits the use of alcohol.  Pt lists DDS as Dr. Ninetta Lights pulled tooth on [address]  Past Medical History  Diagnosis Date  . Hypertension     Past Surgical History  Procedure Date  . Tonsillectomy   . Wisdom tooth extraction     Family History  Problem Relation Age of Onset  . Hypertension Mother     History  Substance Use Topics  . Smoking status: Current Everyday Smoker    Types: Cigarettes  . Smokeless tobacco: Not on file  . Alcohol Use: Yes   Review of Systems  All other systems reviewed and are negative.   except as noted HPI   Allergies  Tramadol and Vicodin  Home Medications   Current Outpatient Rx  Name Route Sig Dispense Refill  . ACETAMINOPHEN 500 MG PO TABS Oral Take 1,500 mg by mouth every 6 (six) hours as needed. For pain    . IBUPROFEN 200 MG PO TABS Oral Take 400 mg by mouth every 6 (six) hours as needed. For headaches    . IBUPROFEN 600 MG PO TABS Oral Take 1 tablet (600 mg total) by mouth every 6 (six) hours as needed for  pain. 30 tablet 0  . OXYCODONE-ACETAMINOPHEN 5-325 MG PO TABS Oral Take 2 tablets by mouth every 4 (four) hours as needed for pain. 6 tablet 0  . PENICILLIN V POTASSIUM 500 MG PO TABS Oral Take 1 tablet (500 mg total) by mouth 4 (four) times daily. 40 tablet 0    BP 131/90  Pulse 81  Temp 98.5 F (36.9 C) (Oral)  Resp 17  SpO2 98%  Physical Exam  Nursing note and vitals reviewed. Constitutional: He is oriented to person, place, and time. He appears well-developed and well-nourished. No distress.  HENT:  Head: Normocephalic and atraumatic.  Mouth/Throat: Oropharynx is clear and moist.         Tooth 15 tenderness to percussion, no obvious dental decay. Tooth 16 is the sight of extraction.  Eyes: Conjunctivae and EOM are normal. Pupils are equal, round, and reactive to light.  Neck: Neck supple. No tracheal deviation present.  Cardiovascular: Normal rate, regular rhythm, normal heart sounds and intact distal pulses.  Exam reveals no gallop and no friction rub.   No murmur heard. Pulmonary/Chest: Effort normal. No respiratory distress. He has no wheezes. He has no rales.  Abdominal: Soft. Bowel sounds are normal. He exhibits no distension and no mass. There is no tenderness. There  is no rebound and no guarding.  Musculoskeletal: Normal range of motion. He exhibits no edema and no tenderness.  Neurological: He is alert and oriented to person, place, and time. No sensory deficit. Coordination normal.  Skin: Skin is warm and dry.  Psychiatric: He has a normal mood and affect. His behavior is normal.    ED Course  Procedures (including critical care time) DIAGNOSTIC STUDIES: Oxygen Saturation is 98% on room air, normal by my interpretation.    COORDINATION OF CARE: 28- evaluated state of pt's current illness.   Labs Reviewed - No data to display No results found.   1. Pain, dental     MDM  Dental pain possible apical abscess. Recent tooth extraction with site unremarkable.  PCN VK, percocet. Dental f/u.  I personally performed the services described in this documentation, which was scribed in my presence. The recorded information has been reviewed and considered.    Forbes Cellar, MD 07/22/11 2004

## 2011-07-22 NOTE — ED Notes (Signed)
Pt. Has swelling and pain to the left side of his face.  Pt. Has c/o pain and HA at this time.

## 2011-09-27 ENCOUNTER — Emergency Department (HOSPITAL_COMMUNITY)
Admission: EM | Admit: 2011-09-27 | Discharge: 2011-09-27 | Disposition: A | Discharge status: Home / Self Care | Payer: Self-pay | Attending: Emergency Medicine | Admitting: Emergency Medicine

## 2011-09-27 ENCOUNTER — Encounter (HOSPITAL_COMMUNITY): Payer: Self-pay | Admitting: Emergency Medicine

## 2011-09-27 DIAGNOSIS — M545 Low back pain, unspecified: Secondary | ICD-10-CM | POA: Insufficient documentation

## 2011-09-27 DIAGNOSIS — S39012A Strain of muscle, fascia and tendon of lower back, initial encounter: Secondary | ICD-10-CM

## 2011-09-27 DIAGNOSIS — X500XXA Overexertion from strenuous movement or load, initial encounter: Secondary | ICD-10-CM | POA: Insufficient documentation

## 2011-09-27 DIAGNOSIS — F17200 Nicotine dependence, unspecified, uncomplicated: Secondary | ICD-10-CM | POA: Insufficient documentation

## 2011-09-27 DIAGNOSIS — M25569 Pain in unspecified knee: Secondary | ICD-10-CM | POA: Insufficient documentation

## 2011-09-27 DIAGNOSIS — IMO0002 Reserved for concepts with insufficient information to code with codable children: Secondary | ICD-10-CM | POA: Insufficient documentation

## 2011-09-27 DIAGNOSIS — I10 Essential (primary) hypertension: Secondary | ICD-10-CM | POA: Insufficient documentation

## 2011-09-27 MED ORDER — HYDROCODONE-ACETAMINOPHEN 5-325 MG PO TABS: 1.0000 | ORAL_TABLET | Freq: Once | ORAL | Status: DC

## 2011-09-27 MED ORDER — OXYCODONE-ACETAMINOPHEN 5-325 MG PO TABS
1.0000 | ORAL_TABLET | Freq: Once | ORAL | Status: AC
  Administered 2011-09-27: 1 via ORAL
  Filled 2011-09-27: qty 1

## 2011-09-27 MED ORDER — CYCLOBENZAPRINE HCL 10 MG PO TABS: 10.0000 mg | ORAL_TABLET | Freq: Three times a day (TID) | ORAL | Status: AC | PRN

## 2011-09-27 MED ORDER — NAPROXEN 500 MG PO TABS: 500.0000 mg | ORAL_TABLET | Freq: Two times a day (BID) | ORAL | Status: DC

## 2011-09-27 MED ORDER — KETOROLAC TROMETHAMINE 60 MG/2ML IM SOLN
60.0000 mg | Freq: Once | INTRAMUSCULAR | Status: AC
  Administered 2011-09-27: 60 mg via INTRAMUSCULAR
  Filled 2011-09-27: qty 2

## 2011-09-27 NOTE — ED Provider Notes (Signed)
History     CSN: [id number]  Arrival date & time 09/27/11  1416   First MD Initiated Contact with Patient 09/27/11 1739      Chief Complaint  Patient presents with  . left knee pain     (Consider location/radiation/quality/duration/timing/severity/associated sxs/prior treatment) HPI Comments: Patient presents emergency department with chief complaint of left knee pain.  Symptoms is recurrent with previous injury and evaluation approximately one year ago by [place] [facility].  He was told to get an MRI at that time unable to afford the study.  The current episode began on Wednesday while bowling.  Patient denies feeling a pop or catching his knee but reports a twisting sensation that has caused radiating pain up to his back.  Patient states that he's here for pain management and referral to orthopedic doctor.  He denies any numbness, weakness or tingling of extremity.  Associated symptoms includes mild left back pain but he denies any inability to ambulate, saddle paresthesias, loss control of bowel or bladder, IV drug use, cancer, or fever, night sweats or chills.  No other complaints at this time.  Patient is a 26 y.o. male presenting with knee pain. The history is provided by the patient.  Knee Pain This is a recurrent problem.    Past Medical History  Diagnosis Date  . Hypertension     Past Surgical History  Procedure Date  . Tonsillectomy   . Wisdom tooth extraction     Family History  Problem Relation Age of Onset  . Hypertension Mother     History  Substance Use Topics  . Smoking status: Current Everyday Smoker    Types: Cigarettes  . Smokeless tobacco: Not on file  . Alcohol Use: Yes      Review of Systems  All other systems reviewed and are negative.    Allergies  Tramadol and Vicodin  Home Medications   Current Outpatient Rx  Name Route Sig Dispense Refill  . IBUPROFEN 200 MG PO TABS Oral Take 400 mg by mouth every 6 (six) hours as needed. For  Pain      BP 124/85  Pulse 90  Temp 98.5 F (36.9 C) (Oral)  Resp 20  SpO2 100%  Physical Exam  Nursing note and vitals reviewed. Constitutional: He is oriented to person, place, and time. He appears well-developed and well-nourished. No distress.  HENT:  Head: Normocephalic and atraumatic.  Eyes: Conjunctivae and EOM are normal.  Neck: Normal range of motion. Neck supple.  Cardiovascular:       Intact distal pulses, capillary refill < 3 seconds  Pulmonary/Chest: Effort normal.  Musculoskeletal: Normal range of motion.       Left knee: He exhibits normal range of motion, no swelling, no effusion, no ecchymosis, no deformity and no erythema. tenderness found.       Lumbar back: He exhibits tenderness, pain and spasm. He exhibits normal range of motion, no bony tenderness, no swelling and no edema.       Back:       Legs:      All other extremities with normal ROM  Neurological: He is alert and oriented to person, place, and time.       Strength 5/5 bilaterally, intact distal sensation, normal gait   Skin: Skin is warm and dry. No rash noted. He is not diaphoretic.       Skin intact, no tenting  Psychiatric: He has a normal mood and affect. His behavior is normal.  ED Course  Procedures (including critical care time)  Labs Reviewed - No data to display No results found.   No diagnosis found.    MDM  Muscle strain, knee pain  Patient with back & knee pain.  No neurological deficits and normal neuro exam.  Patient can walk but states is painful.  No loss of bowel or bladder control.  No concern for cauda equina.  No fever, night sweats, weight loss, h/o cancer, IVDU.  RICE protocol and pain medicine indicated and discussed with patient.          Jaci Carrel, [place] 09/27/11 1830

## 2011-09-27 NOTE — ED Notes (Signed)
Pt complaining of left knee pain and states he bowls on Wednesdays and puts "alot of pressure on that knee." Pt states, this Wednesday the pain got really bad and started to radiate up his left leg into his lower back.

## 2011-09-28 NOTE — ED Provider Notes (Signed)
Medical screening examination/treatment/procedure(s) were performed by non-physician practitioner and as supervising physician I was immediately available for consultation/collaboration.  Dorrance Sellick, MD 09/28/11 0010 

## 2011-09-30 ENCOUNTER — Emergency Department (HOSPITAL_COMMUNITY)
Admission: EM | Admit: 2011-09-30 | Discharge: 2011-09-30 | Disposition: A | Discharge status: Home / Self Care | Payer: Self-pay | Attending: Emergency Medicine | Admitting: Emergency Medicine

## 2011-09-30 ENCOUNTER — Encounter (HOSPITAL_COMMUNITY): Payer: Self-pay | Admitting: *Deleted

## 2011-09-30 DIAGNOSIS — Z888 Allergy status to other drugs, medicaments and biological substances status: Secondary | ICD-10-CM | POA: Insufficient documentation

## 2011-09-30 DIAGNOSIS — F17200 Nicotine dependence, unspecified, uncomplicated: Secondary | ICD-10-CM | POA: Insufficient documentation

## 2011-09-30 DIAGNOSIS — Z885 Allergy status to narcotic agent status: Secondary | ICD-10-CM | POA: Insufficient documentation

## 2011-09-30 DIAGNOSIS — Z8249 Family history of ischemic heart disease and other diseases of the circulatory system: Secondary | ICD-10-CM | POA: Insufficient documentation

## 2011-09-30 DIAGNOSIS — K089 Disorder of teeth and supporting structures, unspecified: Secondary | ICD-10-CM | POA: Insufficient documentation

## 2011-09-30 DIAGNOSIS — R51 Headache: Secondary | ICD-10-CM | POA: Insufficient documentation

## 2011-09-30 DIAGNOSIS — G8929 Other chronic pain: Secondary | ICD-10-CM | POA: Insufficient documentation

## 2011-09-30 DIAGNOSIS — I10 Essential (primary) hypertension: Secondary | ICD-10-CM | POA: Insufficient documentation

## 2011-09-30 MED ORDER — MELOXICAM 7.5 MG PO TABS: 15.0000 mg | ORAL_TABLET | Freq: Every day | ORAL | Status: DC

## 2011-09-30 MED ORDER — PREDNISONE 20 MG PO TABS
10.0000 mg | ORAL_TABLET | Freq: Every day | ORAL | Status: DC
  Administered 2011-09-30: 10 mg via ORAL
  Filled 2011-09-30: qty 1

## 2011-09-30 MED ORDER — DIPHENHYDRAMINE HCL 25 MG PO CAPS
25.0000 mg | ORAL_CAPSULE | Freq: Four times a day (QID) | ORAL | Status: DC | PRN
  Administered 2011-09-30: 25 mg via ORAL
  Filled 2011-09-30: qty 1

## 2011-09-30 MED ORDER — METOCLOPRAMIDE HCL 10 MG PO TABS
10.0000 mg | ORAL_TABLET | Freq: Once | ORAL | Status: AC
  Administered 2011-09-30: 10 mg via ORAL
  Filled 2011-09-30: qty 1

## 2011-09-30 NOTE — ED Notes (Addendum)
To ED for eval of HA for the past cple of days. Photophobia. Nausea.

## 2011-10-06 NOTE — ED Provider Notes (Signed)
History     CSN: [id number]  Arrival date & time 09/30/11  1757   First MD Initiated Contact with Patient 09/30/11 2138      Chief Complaint  Patient presents with  . Headache    (Consider location/radiation/quality/duration/timing/severity/associated sxs/prior treatment) HPI Comments: Luis Wall 26 y.o. male   The chief complaint is: Patient presents with:   Headache    26 y/o patient who has been seen frequently in the ED for pain complaints presents today with cc of headache emanating form his wisdom teeth. He states that he has 3 impacted wisdom teeth that are giving him a headache. Onset 2 days. Location bilateral frontotemporal. Constant, dull, aggravated by light, not alleviated by anything including naproxen.  Patient rates pain at 10/10. Denies sudden onset "thunderclap" type H/A. Denies n/v/ phonophobia or auras. Denies fever, neck pain, neck stiffness, or rash.  Denies chills, myalgias, arthralgias, abdominal pain or diarrhea.Patient denies, any discharge from gums, swollen glands,trismus, or difficulty breathing or swallowing.      Patient is a 26 y.o. male presenting with headaches. The history is provided by the patient and medical records. No language interpreter was used.  Headache  This is a recurrent problem. The current episode started 2 days ago. The problem occurs constantly. The problem has not changed since onset.The pain is located in the temporal, bilateral and frontal region. The quality of the pain is described as dull. The pain is at a severity of 10/10. The pain is severe. Pertinent negatives include no anorexia, no fever, no malaise/fatigue, no chest pressure, no near-syncope, no orthopnea, no palpitations, no syncope, no shortness of breath, no nausea and no vomiting. He has tried NSAIDs for the symptoms. The treatment provided no relief.    Past Medical History  Diagnosis Date  . Hypertension     Past Surgical History  Procedure Date  .  Tonsillectomy   . Wisdom tooth extraction     Family History  Problem Relation Age of Onset  . Hypertension Mother     History  Substance Use Topics  . Smoking status: Current Everyday Smoker    Types: Cigarettes  . Smokeless tobacco: Not on file  . Alcohol Use: Yes      Review of Systems  Constitutional: Negative for fever, chills and malaise/fatigue.  HENT: Positive for dental problem. Negative for ear pain, sore throat, facial swelling, mouth sores, trouble swallowing, neck pain and neck stiffness.   Respiratory: Negative for choking, shortness of breath and stridor.   Cardiovascular: Negative for chest pain, palpitations, orthopnea, syncope and near-syncope.  Gastrointestinal: Negative for nausea, vomiting, abdominal pain, diarrhea and anorexia.  Skin: Negative for rash.  Neurological: Positive for headaches. Negative for seizures, syncope, facial asymmetry, speech difficulty and light-headedness.    Allergies  Flexeril; Tramadol; and Vicodin  Home Medications   Current Outpatient Rx  Name Route Sig Dispense Refill  . CYCLOBENZAPRINE HCL 10 MG PO TABS Oral Take 1 tablet (10 mg total) by mouth 3 (three) times daily as needed for muscle spasms. 30 tablet 0  . IBUPROFEN 200 MG PO TABS Oral Take 400 mg by mouth every 6 (six) hours as needed. For Pain    . NAPROXEN 500 MG PO TABS Oral Take 1 tablet (500 mg total) by mouth 2 (two) times daily. 30 tablet 0  . MELOXICAM 7.5 MG PO TABS Oral Take 2 tablets (15 mg total) by mouth daily. 7 tablet 0    BP 140/86  Pulse 85  Temp  98.5 F (36.9 C) (Oral)  Resp 18  SpO2 98%  Physical Exam  Nursing note and vitals reviewed. Constitutional: He is oriented to person, place, and time. He appears well-developed and well-nourished. No distress.  HENT:  Head: Normocephalic and atraumatic. No trismus in the jaw.  Mouth/Throat: Uvula is midline and oropharynx is clear and moist. No oral lesions. Normal dentition. No dental abscesses,  uvula swelling or dental caries.       No visible caries, no gingival edema or erythema. No areas of fluctuance. No signs of infection.  Eyes: Conjunctivae are normal. No scleral icterus.  Neck: Normal range of motion. Neck supple.  Cardiovascular: Normal rate, regular rhythm and normal heart sounds.   Pulmonary/Chest: Effort normal and breath sounds normal. No respiratory distress. He has no wheezes.       No stridor  Abdominal: Soft. There is no tenderness.  Musculoskeletal: He exhibits no edema.  Lymphadenopathy:    He has no cervical adenopathy.  Neurological: He is alert and oriented to person, place, and time. He has normal reflexes. He displays normal reflexes. No cranial nerve deficit or sensory deficit. He exhibits normal muscle tone. Coordination normal.  Skin: Skin is warm and dry. He is not diaphoretic.  Psychiatric: His behavior is normal.    ED Course  Procedures (including critical care time)  Labs Reviewed - No data to display No results found.  Patient dentition appears intact. No visible signs of imapction or erupting teeth.  No gingival erythema.  Patient states that his headache is bothered by light yet every light is on in the room.  He is eating and watching TV with the volume up.  Patient asks for narcotic pain medication almost immediately upon arrival, however, he does not appear to be uncomfortable. No focal neuro deficits  No signs indicating SAH, or meningitis.  No signs of ludwig's angina  I am going to treat the patient's headache with non-narcotic migraine cocktail and d/c with  mobic and dental f/u. 1. Headache   2. Chronic dental pain       MDM  Discussed reasons to seek immediate care. Patient expresses understanding and agrees with plan.         Arthor Captain, PA-C 10/06/11 2204

## 2011-10-11 NOTE — ED Provider Notes (Signed)
Medical screening examination/treatment/procedure(s) were performed by non-physician practitioner and as supervising physician I was immediately available for consultation/collaboration.  Tobin Chad, MD 10/11/11 [phone]

## 2012-01-07 ENCOUNTER — Emergency Department (HOSPITAL_COMMUNITY)
Admission: EM | Admit: 2012-01-07 | Discharge: 2012-01-07 | Disposition: A | Discharge status: Home / Self Care | Payer: Self-pay | Attending: Emergency Medicine | Admitting: Emergency Medicine

## 2012-01-07 ENCOUNTER — Encounter (HOSPITAL_COMMUNITY): Payer: Self-pay | Admitting: Emergency Medicine

## 2012-01-07 DIAGNOSIS — F17200 Nicotine dependence, unspecified, uncomplicated: Secondary | ICD-10-CM | POA: Insufficient documentation

## 2012-01-07 DIAGNOSIS — L0291 Cutaneous abscess, unspecified: Secondary | ICD-10-CM

## 2012-01-07 DIAGNOSIS — L03317 Cellulitis of buttock: Secondary | ICD-10-CM | POA: Insufficient documentation

## 2012-01-07 DIAGNOSIS — L0231 Cutaneous abscess of buttock: Secondary | ICD-10-CM | POA: Insufficient documentation

## 2012-01-07 MED ORDER — OXYCODONE-ACETAMINOPHEN 5-325 MG PO TABS
1.0000 | ORAL_TABLET | Freq: Once | ORAL | Status: AC
  Administered 2012-01-07: 1 via ORAL
  Filled 2012-01-07: qty 1

## 2012-01-07 MED ORDER — SULFAMETHOXAZOLE-TRIMETHOPRIM 800-160 MG PO TABS: 1.0000 | ORAL_TABLET | Freq: Two times a day (BID) | ORAL | Status: DC

## 2012-01-07 MED ORDER — IBUPROFEN 600 MG PO TABS: 600.0000 mg | ORAL_TABLET | Freq: Four times a day (QID) | ORAL | Status: DC | PRN

## 2012-01-07 NOTE — ED Provider Notes (Signed)
History     CSN: [id number]  Arrival date & time 01/07/12  1507   First MD Initiated Contact with Patient 01/07/12 1523      Chief Complaint  Patient presents with  . Abscess    (Consider location/radiation/quality/duration/timing/severity/associated sxs/prior treatment) HPI Comments: Patient presents with complaint of left buttocks abscess that has been worsening for the past 3 days. He states it has been draining green pus and blood. No treatments at home. He has not had one this bad before and has never sought treatment in the past for them before. Patient denies fever, nausea or vomiting. No pain or problems with defecation. Onset gradual. Course is constant. Nothing makes symptoms better.   The history is provided by the patient.    Past Medical History  Diagnosis Date  . Hypertension     Past Surgical History  Procedure Date  . Tonsillectomy   . Wisdom tooth extraction     Family History  Problem Relation Age of Onset  . Hypertension Mother     History  Substance Use Topics  . Smoking status: Current Every Day Smoker    Types: Cigarettes  . Smokeless tobacco: Not on file  . Alcohol Use: Yes      Review of Systems  Constitutional: Negative for fever.  Gastrointestinal: Negative for nausea, vomiting, blood in stool and rectal pain.  Skin: Negative for color change.       Positive for abscess  Hematological: Negative for adenopathy.    Allergies  Flexeril; Tramadol; and Vicodin  Home Medications   Current Outpatient Rx  Name  Route  Sig  Dispense  Refill  . IBUPROFEN 600 MG PO TABS   Oral   Take 1 tablet (600 mg total) by mouth every 6 (six) hours as needed for pain.   20 tablet   0   . SULFAMETHOXAZOLE-TRIMETHOPRIM 800-160 MG PO TABS   Oral   Take 1 tablet by mouth every 12 (twelve) hours.   14 tablet   0     BP 159/96  Pulse 103  Temp 99.2 F (37.3 C)  Resp 18  SpO2 99%  Physical Exam  Nursing note and vitals  reviewed. Constitutional: He appears well-developed and well-nourished.  HENT:  Head: Normocephalic and atraumatic.  Eyes: Conjunctivae normal are normal.  Neck: Normal range of motion. Neck supple.  Pulmonary/Chest: No respiratory distress.  Neurological: He is alert.  Skin: Skin is warm and dry.       There is approximately 2 cm abscess of the left buttocks at the gluteal cleft. The area is actively draining. Very minimal induration laterally. No surrounding cellulitis.  Psychiatric: He has a normal mood and affect.    ED Course  Procedures (including critical care time)  Labs Reviewed - No data to display No results found.   1. Abscess     4:07 PM Patient seen and examined. Patient counseled on use of abx. Counseled on warm soaks/compresses. Counseled that he may need to return if the area becomes more swollen.   Vital signs reviewed and are as follows: Filed Vitals:   01/07/12 1520  BP: 159/96  Pulse: 103  Temp: 99.2 F (37.3 C)  Resp: 18      MDM  Patient with skin abscess. It is already draining and decompressed. No need to I&D at this point. Will give abx. No signs of cellulitis is surrounding skin. Will d/c to home.  Abx given. Patient to continue warm compresses. There is possibility that  abscess could swelling and enlarge. At that point, I&D would be needed. Patient counseled when to return. No evidence or suspicion for rectal or GI involvement.        Renne Crigler, [place] 01/09/12 1043

## 2012-01-08 ENCOUNTER — Encounter (HOSPITAL_COMMUNITY): Payer: Self-pay | Admitting: Emergency Medicine

## 2012-01-08 ENCOUNTER — Emergency Department (HOSPITAL_COMMUNITY)
Admission: EM | Admit: 2012-01-08 | Discharge: 2012-01-08 | Disposition: A | Discharge status: Home / Self Care | Payer: Self-pay | Attending: Emergency Medicine | Admitting: Emergency Medicine

## 2012-01-08 DIAGNOSIS — I10 Essential (primary) hypertension: Secondary | ICD-10-CM | POA: Insufficient documentation

## 2012-01-08 DIAGNOSIS — F419 Anxiety disorder, unspecified: Secondary | ICD-10-CM

## 2012-01-08 DIAGNOSIS — T50905A Adverse effect of unspecified drugs, medicaments and biological substances, initial encounter: Secondary | ICD-10-CM

## 2012-01-08 DIAGNOSIS — F17200 Nicotine dependence, unspecified, uncomplicated: Secondary | ICD-10-CM | POA: Insufficient documentation

## 2012-01-08 DIAGNOSIS — F411 Generalized anxiety disorder: Secondary | ICD-10-CM | POA: Insufficient documentation

## 2012-01-08 DIAGNOSIS — L0231 Cutaneous abscess of buttock: Secondary | ICD-10-CM | POA: Insufficient documentation

## 2012-01-08 DIAGNOSIS — T40605A Adverse effect of unspecified narcotics, initial encounter: Secondary | ICD-10-CM | POA: Insufficient documentation

## 2012-01-08 MED ORDER — BACITRACIN ZINC 500 UNIT/GM EX OINT
TOPICAL_OINTMENT | CUTANEOUS | Status: AC
  Filled 2012-01-08: qty 1.8

## 2012-01-08 MED ORDER — LORAZEPAM 1 MG PO TABS
1.0000 mg | ORAL_TABLET | Freq: Once | ORAL | Status: AC
  Administered 2012-01-08: 1 mg via ORAL
  Filled 2012-01-08: qty 1

## 2012-01-08 MED ORDER — SULFAMETHOXAZOLE-TMP DS 800-160 MG PO TABS
1.0000 | ORAL_TABLET | Freq: Once | ORAL | Status: AC
  Administered 2012-01-08: 1 via ORAL
  Filled 2012-01-08: qty 1

## 2012-01-08 NOTE — ED Notes (Signed)
Pt stated that he was given Percocet 5/325mg  1 tablet yesterday at 4pm--- states ever since, he has been "anxious", states "like having a panic attack"; pt reports that anxiety runs in his family.  Pt is currently on antibiotic (Septra) for his boil-- Septra was also started yesterday.

## 2012-01-08 NOTE — ED Provider Notes (Signed)
History     CSN: [id number]  Arrival date & time 01/08/12  0551   First MD Initiated Contact with Patient 01/08/12 [phone]      Chief Complaint  Patient presents with  . Anxiety    (Consider location/radiation/quality/duration/timing/severity/associated sxs/prior treatment) HPI Luis Wall is a 26 y.o. male presenting with anxiety, funny feelings and inability to sleep. He was seen in ER yesterday for an abscess in his buttocks, given a Percocet and started on Bactrim. Patient says he's had adverse reactions to Percocet in the past. He's been unable to sleep due to feeling jittery - he denies his mind is racing or grandiose ideas. Denies any history of bipolar disorder. Denies any delusions, hallucinations, suicidal or homicidal ideations.  Patient's pain in the buttocks is moderate to severe, is nonradiating, sharp.    Past Medical History  Diagnosis Date  . Hypertension     Past Surgical History  Procedure Date  . Tonsillectomy   . Wisdom tooth extraction     Family History  Problem Relation Age of Onset  . Hypertension Mother     History  Substance Use Topics  . Smoking status: Current Every Day Smoker    Types: Cigarettes  . Smokeless tobacco: Not on file  . Alcohol Use: Yes      Review of Systems At least 10pt or greater review of systems completed and are negative except where specified in the HPI.  Allergies  Flexeril; Tramadol; and Vicodin  Home Medications   Current Outpatient Rx  Name  Route  Sig  Dispense  Refill  . SULFAMETHOXAZOLE-TRIMETHOPRIM 800-160 MG PO TABS   Oral   Take 1 tablet by mouth every 12 (twelve) hours.   14 tablet   0   . IBUPROFEN 600 MG PO TABS   Oral   Take 1 tablet (600 mg total) by mouth every 6 (six) hours as needed for pain.   20 tablet   0     BP 145/84  Pulse 83  Temp 98.1 F (36.7 C) (Oral)  Resp 16  SpO2 99%  Physical Exam  Nursing notes reviewed.  Electronic medical record reviewed. VITAL SIGNS:    Filed Vitals:   01/08/12 0603 01/08/12 0754  BP: 146/85 145/84  Pulse: 94 83  Temp: 99.2 F (37.3 C) 98.1 F (36.7 C)  TempSrc: Oral Oral  Resp: 18 16  SpO2: 100% 99%   CONSTITUTIONAL: Awake, oriented, appears non-toxic HENT: Atraumatic, normocephalic, oral mucosa pink and moist, airway patent. Nares patent without drainage. External ears normal. EYES: Conjunctiva clear, EOMI, PERRLA NECK: Trachea midline, non-tender, supple CARDIOVASCULAR: Normal heart rate, Normal rhythm, No murmurs, rubs, gallops PULMONARY/CHEST: Clear to auscultation, no rhonchi, wheezes, or rales. Symmetrical breath sounds. Non-tender. ABDOMINAL: Non-distended, soft, non-tender - no rebound or guarding.  BS normal. Back: Unremarkable. Buttocks have multiple hyperpigmented scars about the size of a pencil eraser. Patient has a 4 x 5 cm area of cellulitis on along the left buttocks and into the gluteal cleft. NEUROLOGIC: Non-focal, moving all four extremities, no gross sensory or motor deficits. EXTREMITIES: No clubbing, cyanosis, or edema SKIN: Warm, Dry, No erythema, No rash  ED Course  [place] bedside Performed by: Jones Skene Authorized by: Jones Skene Consent: Verbal consent obtained. Comments: Ultrasound patient's Botox shows that there is some diffuse cellulitis in the left clear cleft no discrete area of abscess   (including critical care time)  Labs Reviewed - No data to display No results found.   1. Medication  adverse effect   2. Anxiety   3. Cellulitis and abscess of buttock       MDM  Luis Wall is a 26 y.o. male presenting with anxiety he says was touched off by taking a Percocet yesterday here in the emergency department.  Reexamined patient's Botox- there is no central area of abscess, there is significant area of cellulitis in the gluteal cleft approximately 4x5cm. ultrasound examination shows diffuse tissue inflammation no discrete abscess. There is no induration area no  fluctuance. Give the patient one is scheduled Bactrim here in the ER as well as 1 mg of Ativan. Instructed him not to take any more hydrocodone or oxycodone. He may take ibuprofen for pain he continued sitz baths or warm compresses. Instructed to keep the area clean he is given gauze, bacitracin ointment instructions on dressing changes.  Patient's also given resources for counselor psychiatric resources in the area for his anxiety. I do not think his anxiety is significant enough to start something in the emergency department, skin and given 1 mg of Ativan do not think any further medications are required as it is likely a medication side effect.  I explained the diagnosis and have given explicit precautions to return to the ER including worsening pain, systemic symptoms such as fevers, nausea or vomiting or any other new or worsening symptoms. The patient understands and accepts the medical plan as it's been dictated and I have answered their questions. Discharge instructions concerning home care and prescriptions have been given.  The patient is STABLE and is discharged to home in good condition.         Jones Skene, MD 01/08/12 1610

## 2012-01-08 NOTE — ED Notes (Signed)
[id number]<WN> Expected date:<BR> Expected time:<BR> Means of arrival:<BR> Comments:<BR> Medic, 26 M, Insomnia

## 2012-01-08 NOTE — ED Notes (Signed)
Brought in by EMS from home with c/o "feeling funny" and "unable to sleep" since after he took Percocet at 4pm yesterday.  Per EMS, pt was seen here most recently for his "boil" and was discharged home with Percocet--- Pt took Percocet but he has been "feeling funny" and "unable to sleep".

## 2012-01-08 NOTE — ED Notes (Signed)
Pt given resource sheet

## 2012-01-09 NOTE — ED Provider Notes (Signed)
Medical screening examination/treatment/procedure(s) were performed by non-physician practitioner and as supervising physician I was immediately available for consultation/collaboration.  Harsh Trulock, MD 01/09/12 1410 

## 2012-03-01 ENCOUNTER — Emergency Department (HOSPITAL_COMMUNITY): Payer: Self-pay

## 2012-03-01 ENCOUNTER — Encounter (HOSPITAL_COMMUNITY): Payer: Self-pay | Admitting: Emergency Medicine

## 2012-03-01 ENCOUNTER — Emergency Department (HOSPITAL_COMMUNITY)
Admission: EM | Admit: 2012-03-01 | Discharge: 2012-03-01 | Disposition: A | Discharge status: Home / Self Care | Payer: Self-pay | Attending: Emergency Medicine | Admitting: Emergency Medicine

## 2012-03-01 DIAGNOSIS — M25476 Effusion, unspecified foot: Secondary | ICD-10-CM | POA: Insufficient documentation

## 2012-03-01 DIAGNOSIS — S9031XA Contusion of right foot, initial encounter: Secondary | ICD-10-CM

## 2012-03-01 DIAGNOSIS — Y999 Unspecified external cause status: Secondary | ICD-10-CM | POA: Insufficient documentation

## 2012-03-01 DIAGNOSIS — M545 Low back pain, unspecified: Secondary | ICD-10-CM | POA: Insufficient documentation

## 2012-03-01 DIAGNOSIS — S9030XA Contusion of unspecified foot, initial encounter: Secondary | ICD-10-CM | POA: Insufficient documentation

## 2012-03-01 DIAGNOSIS — I10 Essential (primary) hypertension: Secondary | ICD-10-CM | POA: Insufficient documentation

## 2012-03-01 DIAGNOSIS — M25473 Effusion, unspecified ankle: Secondary | ICD-10-CM | POA: Insufficient documentation

## 2012-03-01 DIAGNOSIS — Y929 Unspecified place or not applicable: Secondary | ICD-10-CM | POA: Insufficient documentation

## 2012-03-01 DIAGNOSIS — Y939 Activity, unspecified: Secondary | ICD-10-CM | POA: Insufficient documentation

## 2012-03-01 DIAGNOSIS — F17200 Nicotine dependence, unspecified, uncomplicated: Secondary | ICD-10-CM | POA: Insufficient documentation

## 2012-03-01 MED ORDER — OXYCODONE-ACETAMINOPHEN 5-325 MG PO TABS: 1.0000 | ORAL_TABLET | ORAL | Status: DC | PRN

## 2012-03-01 MED ORDER — NAPROXEN 500 MG PO TABS: 500.0000 mg | ORAL_TABLET | Freq: Two times a day (BID) | ORAL | Status: DC

## 2012-03-01 MED ORDER — OXYCODONE-ACETAMINOPHEN 5-325 MG PO TABS
2.0000 | ORAL_TABLET | Freq: Once | ORAL | Status: AC
  Administered 2012-03-01: 2 via ORAL
  Filled 2012-03-01: qty 2
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
## 2012-03-01 NOTE — ED Notes (Signed)
MD at bedside. 

## 2012-03-01 NOTE — ED Notes (Signed)
Pt transported to XR.  

## 2012-04-03 ENCOUNTER — Emergency Department (HOSPITAL_COMMUNITY): Payer: Self-pay

## 2012-04-03 ENCOUNTER — Encounter (HOSPITAL_COMMUNITY): Payer: Self-pay | Admitting: Emergency Medicine

## 2012-04-03 ENCOUNTER — Emergency Department (HOSPITAL_COMMUNITY)
Admission: EM | Admit: 2012-04-03 | Discharge: 2012-04-03 | Disposition: A | Discharge status: Home / Self Care | Payer: Self-pay | Attending: Emergency Medicine | Admitting: Emergency Medicine

## 2012-04-03 DIAGNOSIS — Y92009 Unspecified place in unspecified non-institutional (private) residence as the place of occurrence of the external cause: Secondary | ICD-10-CM | POA: Insufficient documentation

## 2012-04-03 DIAGNOSIS — Z8659 Personal history of other mental and behavioral disorders: Secondary | ICD-10-CM | POA: Insufficient documentation

## 2012-04-03 DIAGNOSIS — S79919A Unspecified injury of unspecified hip, initial encounter: Secondary | ICD-10-CM | POA: Insufficient documentation

## 2012-04-03 DIAGNOSIS — Y9329 Activity, other involving ice and snow: Secondary | ICD-10-CM | POA: Insufficient documentation

## 2012-04-03 DIAGNOSIS — W010XXA Fall on same level from slipping, tripping and stumbling without subsequent striking against object, initial encounter: Secondary | ICD-10-CM | POA: Insufficient documentation

## 2012-04-03 DIAGNOSIS — F17200 Nicotine dependence, unspecified, uncomplicated: Secondary | ICD-10-CM | POA: Insufficient documentation

## 2012-04-03 DIAGNOSIS — S99929A Unspecified injury of unspecified foot, initial encounter: Secondary | ICD-10-CM | POA: Insufficient documentation

## 2012-04-03 DIAGNOSIS — I10 Essential (primary) hypertension: Secondary | ICD-10-CM | POA: Insufficient documentation

## 2012-04-03 DIAGNOSIS — S8990XA Unspecified injury of unspecified lower leg, initial encounter: Secondary | ICD-10-CM | POA: Insufficient documentation

## 2012-04-03 DIAGNOSIS — X500XXA Overexertion from strenuous movement or load, initial encounter: Secondary | ICD-10-CM | POA: Insufficient documentation

## 2012-04-03 DIAGNOSIS — IMO0002 Reserved for concepts with insufficient information to code with codable children: Secondary | ICD-10-CM | POA: Insufficient documentation

## 2012-04-03 HISTORY — DX: Anxiety disorder, unspecified: F41.9

## 2012-04-03 MED ORDER — IBUPROFEN 800 MG PO TABS: 800.0000 mg | ORAL_TABLET | Freq: Three times a day (TID) | ORAL | Status: DC

## 2012-04-03 MED ORDER — IBUPROFEN 800 MG PO TABS
800.0000 mg | ORAL_TABLET | Freq: Once | ORAL | Status: AC
  Administered 2012-04-03: 800 mg via ORAL
  Filled 2012-04-03: qty 1

## 2012-04-03 NOTE — ED Notes (Signed)
Per EMS- Pt slipped on ice, did not fall. C/o r/knee and hip pain. Pt alert, oriented. Denied LOC

## 2012-04-03 NOTE — ED Notes (Signed)
Patient transported to X-ray 

## 2012-04-03 NOTE — ED Notes (Signed)
[id number]<AV> Expected date:<BR> Expected time:<BR> Means of arrival:<BR> Comments:<BR> 26yo-fall, ankle pain

## 2012-04-03 NOTE — ED Notes (Signed)
Pt reports that he slipped down 3 stairs and twisted is r/leg under him. C/o r/hip and r/knee pain. Denies abrasions or laceration. Denies LOC

## 2012-04-03 NOTE — ED Provider Notes (Signed)
History    This chart was scribed for non-physician practitioner Glade Nurse, PA-C working with Hilario Quarry, MD by Toya Smothers, ED Scribe. This patient was seen in room WTR9/WTR9 and the patient's care was started at 15:28.  CSN: [id number]  Arrival date & time 04/03/12  1444   First MD Initiated Contact with Patient 04/03/12 1524      Chief Complaint  Patient presents with  . Hip Pain  . Knee Pain    r/leg pinned up under buttocks during fall,pt slipped down 3 stairs    The history is provided by the patient. No language interpreter was used.   Associated symptoms include joint swelling. Pertinent negatives include no chest pain, diaphoresis, fever, headaches, nausea, neck pain, numbness, rash, vomiting or weakness.    Luis Wall is a 27 y.o. male brought by EMS to the ED c/o sudden onset, constant, severe right hip, knee, and ankle pain as the result of fall. Pain is aching, aggravated with movement, and alleviated by nothing. Pt reports falling down outside steps to his home, catching his right foot behind him, and landing onto a concrete surface. No cephalic trauma, LOC, bleeding, weakness, dizziness, change in vision, abdominal pain, or HA. Symptoms have not been treated PTA. No recent fever. Pt is a current everyday smoker, admitting alcohol and denying illicit drug use.     Past Medical History  Diagnosis Date  . Hypertension   . Anxiety     Past Surgical History  Procedure Laterality Date  . Tonsillectomy    . Wisdom tooth extraction      Family History  Problem Relation Age of Onset  . Hypertension Mother     History  Substance Use Topics  . Smoking status: Current Every Day Smoker    Types: Cigarettes  . Smokeless tobacco: Not on file  . Alcohol Use: Yes      Review of Systems  Constitutional: Negative for fever and diaphoresis.  HENT: Negative for neck pain and neck stiffness.   Eyes: Negative for visual disturbance.  Respiratory: Negative for  apnea, chest tightness and shortness of breath.   Cardiovascular: Negative for chest pain and palpitations.  Gastrointestinal: Negative for nausea, vomiting, diarrhea and constipation.  Genitourinary: Negative for dysuria.  Musculoskeletal: Positive for back pain, joint swelling and gait problem.       Pain to right hip, right knee, and right ankle  Skin: Negative for rash and wound.  Neurological: Negative for dizziness, syncope, weakness, light-headedness, numbness and headaches.    Allergies  Tramadol; Vicodin; and Flexeril  Home Medications  No current outpatient prescriptions on file.  BP 144/87  Pulse 91  Temp(Src) 98.8 F (37.1 C) (Oral)  SpO2 98%  Physical Exam  Nursing note and vitals reviewed. Constitutional: He is oriented to person, place, and time. He appears well-developed and well-nourished. No distress.  HENT:  Head: Normocephalic and atraumatic.  Eyes: Conjunctivae and EOM are normal.  Neck: Normal range of motion. Neck supple.  No meningeal signs  Cardiovascular: Normal rate, regular rhythm and normal heart sounds.  Exam reveals no gallop and no friction rub.   No murmur heard. Pulmonary/Chest: Effort normal and breath sounds normal. No respiratory distress. He has no wheezes. He has no rales. He exhibits no tenderness.  Abdominal: Soft. Bowel sounds are normal. He exhibits no distension. There is no tenderness. There is no rebound and no guarding.  Musculoskeletal: Normal range of motion. He exhibits no edema and no tenderness.  Minimal  swelling to the right lateral ankle. Pulses are intact.  Ankle: decreased strength, ROM. Weight bearing possible, though painful.  R knee: no swelling, erythema, or warmth. Painful w/ ROM. No bruising. Good quad strength. No joint laxity.  Hip: Painful internal rotation of the right hip.   Neurological: He is alert and oriented to person, place, and time. No cranial nerve deficit.  Sensation to light touch intact.  Skin:  Skin is warm and dry. He is not diaphoretic. No erythema.  Skin intact. No bruising. No deformity at any of injured sites.     ED Course  Procedures DIAGNOSTIC STUDIES: Oxygen Saturation is 98% on room air, normal by my interpretation.    COORDINATION OF CARE: 15:28- Evaluated Pt. Pt is awake, alert, and without distress. 15:45- Ordered ibuprofen (ADVIL,MOTRIN) tablet 800 mg Once. 15:50-  Patient understand and agree with initial ED impression and plan with expectations set for ED visit. 15:58- Ordered DG Knee 2 Views Right, DG Hip Complete Right, and DG Ankle 2 Views Right 1 time imaging.  Labs Reviewed - No data to display No results found.  Dg Hip Complete Right  04/03/2012  *RADIOLOGY REPORT*  Clinical Data: Right lateral hip pain post fall  RIGHT HIP - COMPLETE 2+ VIEW  Comparison: None  Findings: Symmetric hip and SI joints. Osseous mineralization normal. Partial sacralization of the left transverse process of L5. No acute fracture, dislocation or bone destruction.  IMPRESSION: No acute osseous abnormalities.   Original Report Authenticated By: Ulyses Southward, M.D.    Dg Ankle Complete Right  04/03/2012  *RADIOLOGY REPORT*  Clinical Data: Fall, lateral ankle pain/swelling  RIGHT ANKLE - COMPLETE 3+ VIEW  Comparison: None.  Findings: No fracture or dislocation is seen.  The ankle mortise is intact.  The base of the fifth metatarsal is unremarkable.  Visualized soft tissues are grossly unremarkable.  IMPRESSION: No fracture or dislocation is seen.   Original Report Authenticated By: Charline Bills, M.D.    Dg Knee Complete 4 Views Right  04/03/2012  *RADIOLOGY REPORT*  Clinical Data: Knee pain, fall  RIGHT KNEE - COMPLETE 4+ VIEW  Comparison: 04/03/2012  Findings: Normal alignment without fracture or effusion.  Preserved joint spaces.  No significant arthropathy.  IMPRESSION: No acute finding   Original Report Authenticated By: Judie Petit. Miles Costain, M.D.    Diagnosis: musculoskeletal pain    MDM   Chart review reveals pt is a frequent visitor to the ED for pain-related maladies where narcotic pain management was requested. Before, during, and after initial PE, pt asked if he would receive narcotic pain medicine for his injuries, mentioning that only oxycontin works for him. Documented allergies Tramadol, Vicodin, and Flexerol. Pt states his throat closes up. Pt agreed to manage pain in the ED with 800mg  of ibuprofen pending return of imaging.   Note: xray was called twice regarding the significant delay in turnaround. First call, was told that they were simply busy doing the best they can. Second call, xray communicated an error in getting the ankle image read on their end and apologized for the delay. Pt was advised regarding reasons for delay. Pain managed with ibuprofen.  On re-evaluation, pt was lying comfortable in pt chair. In NAD.  No acute findings appreciated for hip, knee, or ankle. Imaging shows no fracture. Directed pt to ice injury, prescribed anti-inflammatories for pain, and to elevate and rest the injury when possible.  At this time there does not appear to be any evidence of an acute emergency medical  condition and the patient appears stable for discharge with appropriate outpatient follow up.Diagnosis was discussed with patient who verbalizes understanding and is agreeable to discharge.   I personally performed the services described in this documentation, which was scribed in my presence. The recorded information has been reviewed and is accurate.    Glade Nurse, PA-C 04/04/12 [phone]

## 2012-04-04 NOTE — ED Provider Notes (Signed)
History/physical exam/procedure(s) were performed by non-physician practitioner and as supervising physician I was immediately available for consultation/collaboration. I have reviewed all notes and am in agreement with care and plan.   Danielle S Ray, MD 04/04/12 2315 

## 2012-05-17 ENCOUNTER — Emergency Department (HOSPITAL_COMMUNITY)
Admission: EM | Admit: 2012-05-17 | Discharge: 2012-05-17 | Disposition: A | Discharge status: Home / Self Care | Payer: Self-pay | Attending: Emergency Medicine | Admitting: Emergency Medicine

## 2012-05-17 ENCOUNTER — Emergency Department (HOSPITAL_COMMUNITY): Payer: Self-pay

## 2012-05-17 ENCOUNTER — Encounter (HOSPITAL_COMMUNITY): Payer: Self-pay | Admitting: Emergency Medicine

## 2012-05-17 DIAGNOSIS — M5416 Radiculopathy, lumbar region: Secondary | ICD-10-CM

## 2012-05-17 DIAGNOSIS — Z8659 Personal history of other mental and behavioral disorders: Secondary | ICD-10-CM | POA: Insufficient documentation

## 2012-05-17 DIAGNOSIS — IMO0002 Reserved for concepts with insufficient information to code with codable children: Secondary | ICD-10-CM | POA: Insufficient documentation

## 2012-05-17 DIAGNOSIS — Y939 Activity, unspecified: Secondary | ICD-10-CM | POA: Insufficient documentation

## 2012-05-17 DIAGNOSIS — I10 Essential (primary) hypertension: Secondary | ICD-10-CM | POA: Insufficient documentation

## 2012-05-17 DIAGNOSIS — Y929 Unspecified place or not applicable: Secondary | ICD-10-CM | POA: Insufficient documentation

## 2012-05-17 DIAGNOSIS — F17200 Nicotine dependence, unspecified, uncomplicated: Secondary | ICD-10-CM | POA: Insufficient documentation

## 2012-05-17 DIAGNOSIS — W108XXA Fall (on) (from) other stairs and steps, initial encounter: Secondary | ICD-10-CM | POA: Insufficient documentation

## 2012-05-17 DIAGNOSIS — W19XXXA Unspecified fall, initial encounter: Secondary | ICD-10-CM

## 2012-05-17 MED ORDER — METHOCARBAMOL 500 MG PO TABS: 500.0000 mg | ORAL_TABLET | Freq: Two times a day (BID) | ORAL | Status: DC

## 2012-05-17 MED ORDER — KETOROLAC TROMETHAMINE 60 MG/2ML IM SOLN
60.0000 mg | Freq: Once | INTRAMUSCULAR | Status: AC
  Administered 2012-05-17: 60 mg via INTRAMUSCULAR
  Filled 2012-05-17: qty 2

## 2012-05-17 MED ORDER — OXYCODONE-ACETAMINOPHEN 5-325 MG PO TABS: 1.0000 | ORAL_TABLET | Freq: Four times a day (QID) | ORAL | Status: DC | PRN

## 2012-05-17 MED ORDER — METHOCARBAMOL 500 MG PO TABS
750.0000 mg | ORAL_TABLET | Freq: Once | ORAL | Status: DC
  Filled 2012-05-17: qty 2

## 2012-05-17 MED ORDER — METHOCARBAMOL 750 MG PO TABS
750.0000 mg | ORAL_TABLET | Freq: Once | ORAL | Status: AC
  Administered 2012-05-17: 750 mg via ORAL
  Filled 2012-05-17: qty 1

## 2012-05-17 NOTE — ED Notes (Signed)
As per EMS, pt slipped off last step falling down on grass. Pt had No LOC or nausea. Pt was Dx with a strained lumbar.VSS

## 2012-05-17 NOTE — ED Provider Notes (Signed)
Medical screening examination/treatment/procedure(s) were performed by non-physician practitioner and as supervising physician I was immediately available for consultation/collaboration.  Olivia Mackie, MD 05/17/12 [phone]

## 2012-05-17 NOTE — ED Notes (Signed)
Bed:WA16<BR> Expected date:<BR> Expected time:<BR> Means of arrival:<BR> Comments:<BR> EMS

## 2012-05-17 NOTE — ED Provider Notes (Signed)
History     CSN: [id number]  Arrival date & time 05/17/12  4696   First MD Initiated Contact with Patient 05/17/12 [phone]      Chief Complaint  Patient presents with  . Fall   HPI  History provided by the patient. This is a 27 year old male with history of hypertension who presents with complaints of fall and low back pain. States he has been dealing with continued low back pain for the past one to 2 months. 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 States he was evaluated at [facility] that time and diagnosed with a lumbar strain. He was given oxycodone and muscle relaxer medications and has been using this for his symptoms but states he ran out. He also reports having an appointment made with a specialist in that area on the 24th. Early this morning patient states that he misstepped and fell off of a step causing him to fall backwards. This has aggravated his low back pain with pain radiating down both lower extremities to the calf area. He denies any weakness or numbness in the lower extremity. Denies any urinary or fecal incontinence, urinary retention or perineal numbness. Pain also radiates up the spine into the shoulders and base of the neck area. Denies any numbness weakness in upper kidneys. No chest pain or shortness of breath. There was no significant head injury no LOC. Patient is not use any treatments for symptoms.      Past Medical History  Diagnosis Date  . Hypertension   . Anxiety     Past Surgical History  Procedure Laterality Date  . Tonsillectomy    . Wisdom tooth extraction      Family History  Problem Relation Age of Onset  . Hypertension Mother     History  Substance Use Topics  . Smoking status: Current Every Day Smoker    Types: Cigarettes  . Smokeless tobacco: Not on file  . Alcohol Use: Yes      Review of Systems  HENT: Negative for neck pain and neck stiffness.     Gastrointestinal: Negative for abdominal pain.  Musculoskeletal: Positive for back pain.  Neurological: Negative for dizziness, weakness, numbness and headaches.  All other systems reviewed and are negative.    Allergies  Tramadol; Vicodin; and Flexeril  Home Medications   Current Outpatient Rx  Name  Route  Sig  Dispense  Refill  . ibuprofen (ADVIL,MOTRIN) 800 MG tablet   Oral   Take 1 tablet (800 mg total) by mouth 3 (three) times daily.   21 tablet   0     BP 138/85  Pulse 99  Temp(Src) 98.4 F (36.9 C) (Oral)  Resp 18  Ht 6\' 1"  (1.854 m)  Wt 286 lb (129.729 kg)  BMI 37.74 kg/m2  SpO2 96%  Physical Exam  Nursing note and vitals reviewed. Constitutional: He is oriented to person, place, and time. He appears well-developed and well-nourished. No distress.  HENT:  Head: Normocephalic.  Cardiovascular: Normal rate and regular rhythm.   Pulmonary/Chest: Effort normal and breath sounds normal. No respiratory distress. He has no wheezes.  Abdominal: Soft. There is no tenderness.  Musculoskeletal: Normal range of motion. He exhibits no edema and no tenderness.       Lumbar back: He exhibits tenderness and swelling. He exhibits no deformity.       Back:  Pain is worsened bilaterally with straight leg test. Normal distal pulses and  sensation in feet bilaterally. Strength equal bilaterally.  Neurological: He is alert and oriented to person, place, and time. He has normal strength. No sensory deficit.  Reflex Scores:      Patellar reflexes are 2+ on the right side and 2+ on the left side. Skin: Skin is warm.  Psychiatric: He has a normal mood and affect. His behavior is normal.    ED Course  Procedures      1. Fall, initial encounter   2. Lumbar radicular pain       MDM  5:15 AM patient seen and evaluated. Patient lying comfortably appears in no acute distress or discomfort.  No red flag or concerning symptoms.        Angus Seller, PA-C 05/17/12  [phone]

## 2012-06-11 ENCOUNTER — Encounter (HOSPITAL_COMMUNITY): Payer: Self-pay | Admitting: Emergency Medicine

## 2012-06-11 ENCOUNTER — Emergency Department (HOSPITAL_COMMUNITY)
Admission: EM | Admit: 2012-06-11 | Discharge: 2012-06-11 | Disposition: A | Discharge status: Home / Self Care | Payer: Self-pay | Attending: Emergency Medicine | Admitting: Emergency Medicine

## 2012-06-11 DIAGNOSIS — F411 Generalized anxiety disorder: Secondary | ICD-10-CM | POA: Insufficient documentation

## 2012-06-11 DIAGNOSIS — W57XXXA Bitten or stung by nonvenomous insect and other nonvenomous arthropods, initial encounter: Secondary | ICD-10-CM | POA: Insufficient documentation

## 2012-06-11 DIAGNOSIS — F17200 Nicotine dependence, unspecified, uncomplicated: Secondary | ICD-10-CM | POA: Insufficient documentation

## 2012-06-11 DIAGNOSIS — M549 Dorsalgia, unspecified: Secondary | ICD-10-CM | POA: Insufficient documentation

## 2012-06-11 DIAGNOSIS — S30860A Insect bite (nonvenomous) of lower back and pelvis, initial encounter: Secondary | ICD-10-CM | POA: Insufficient documentation

## 2012-06-11 DIAGNOSIS — W010XXA Fall on same level from slipping, tripping and stumbling without subsequent striking against object, initial encounter: Secondary | ICD-10-CM | POA: Insufficient documentation

## 2012-06-11 DIAGNOSIS — Y9289 Other specified places as the place of occurrence of the external cause: Secondary | ICD-10-CM | POA: Insufficient documentation

## 2012-06-11 DIAGNOSIS — G8929 Other chronic pain: Secondary | ICD-10-CM | POA: Insufficient documentation

## 2012-06-11 DIAGNOSIS — Y990 Civilian activity done for income or pay: Secondary | ICD-10-CM | POA: Insufficient documentation

## 2012-06-11 DIAGNOSIS — IMO0002 Reserved for concepts with insufficient information to code with codable children: Secondary | ICD-10-CM | POA: Insufficient documentation

## 2012-06-11 DIAGNOSIS — I10 Essential (primary) hypertension: Secondary | ICD-10-CM | POA: Insufficient documentation

## 2012-06-11 MED ORDER — NAPROXEN 375 MG PO TABS: 375.0000 mg | ORAL_TABLET | Freq: Two times a day (BID) | ORAL | Status: DC | PRN

## 2012-06-11 MED ORDER — OXYCODONE-ACETAMINOPHEN 5-325 MG PO TABS
2.0000 | ORAL_TABLET | Freq: Once | ORAL | Status: AC
  Administered 2012-06-11: 2 via ORAL
  Filled 2012-06-11: qty 2

## 2012-06-11 MED ORDER — IBUPROFEN 200 MG PO TABS
600.0000 mg | ORAL_TABLET | Freq: Once | ORAL | Status: AC
  Administered 2012-06-11: 600 mg via ORAL
  Filled 2012-06-11: qty 1

## 2012-06-11 NOTE — ED Provider Notes (Signed)
History    27yM with lower back pain after mechanical fall. Slipped on wet grass shortly before arrival while at work. Chronic lower back but, but worse since. No numbness, tingling or loss of strength. Able to ambulate w/o difficulty. No incontinence or retention. No hx of back surgery. No blood thinners. Denies hx of iv drug use.  CSN: [id number]  Arrival date & time 06/11/12  1619   First MD Initiated Contact with Patient 06/11/12 1638      Chief Complaint  Patient presents with  . Fall    (Consider location/radiation/quality/duration/timing/severity/associated sxs/prior treatment) HPI  Past Medical History  Diagnosis Date  . Hypertension   . Anxiety     Past Surgical History  Procedure Laterality Date  . Tonsillectomy    . Wisdom tooth extraction      Family History  Problem Relation Age of Onset  . Hypertension Mother     History  Substance Use Topics  . Smoking status: Current Every Day Smoker    Types: Cigarettes  . Smokeless tobacco: Not on file  . Alcohol Use: Yes      Review of Systems  All systems reviewed and negative, other than as noted in HPI.   Allergies  Flexeril; Ibuprofen; Naproxen; Tramadol; and Vicodin  Home Medications   Current Outpatient Rx  Name  Route  Sig  Dispense  Refill  . ibuprofen (ADVIL,MOTRIN) 600 MG tablet   Oral   Take 600 mg by mouth every 4 (four) hours as needed for pain.         . naproxen (NAPROSYN) 375 MG tablet   Oral   Take 1 tablet (375 mg total) by mouth 2 (two) times daily as needed.   20 tablet   0     BP 138/83  Pulse 104  Temp(Src) 98.2 F (36.8 C) (Oral)  Resp 20  SpO2 99%  Physical Exam  Nursing note and vitals reviewed. Constitutional: He appears well-developed and well-nourished. No distress.  HENT:  Head: Normocephalic and atraumatic.  Eyes: Conjunctivae are normal. Right eye exhibits no discharge. Left eye exhibits no discharge.  Neck: Neck supple.  Cardiovascular: Normal rate,  regular rhythm and normal heart sounds.  Exam reveals no gallop and no friction rub.   No murmur heard. Pulmonary/Chest: Effort normal and breath sounds normal. No respiratory distress.  Abdominal: Soft. He exhibits no distension. There is no tenderness.  Musculoskeletal: He exhibits no edema and no tenderness.       Arms: Mild tenderness in depicted area. No concerning skin changes here. Incidentally noted tick upper back. Not engorged. No surrounding skin changes.   Neurological: He is alert. He exhibits normal muscle tone. Coordination normal.  Strength 5/5 LE. Gait steady. Sensation intact to light touch.   Skin: Skin is warm and dry.  Psychiatric: He has a normal mood and affect. His behavior is normal. Thought content normal.    ED Course  Procedures (including critical care time)  Labs Reviewed - No data to display No results found.   1. Back pain   2. Tick bite of back, initial encounter       MDM  27 year old male with lower back pain after mechanical fall. Mild tenderness on exam. Nonfocal neurological examination. Consistent with strain. Incidentally noted tick on upper back. This was removed. It was not engorged. No surrounding skin changes.        Raeford Razor, MD 06/11/12 1700

## 2012-06-11 NOTE — ED Notes (Signed)
Per pt slipped and fell on some grass, having lower back pain

## 2012-06-16 ENCOUNTER — Emergency Department (HOSPITAL_COMMUNITY)
Admission: EM | Admit: 2012-06-16 | Discharge: 2012-06-16 | Disposition: A | Discharge status: Home / Self Care | Payer: Self-pay | Attending: Emergency Medicine | Admitting: Emergency Medicine

## 2012-06-16 ENCOUNTER — Encounter (HOSPITAL_COMMUNITY): Payer: Self-pay | Admitting: Emergency Medicine

## 2012-06-16 DIAGNOSIS — G8929 Other chronic pain: Secondary | ICD-10-CM | POA: Insufficient documentation

## 2012-06-16 DIAGNOSIS — Z8659 Personal history of other mental and behavioral disorders: Secondary | ICD-10-CM | POA: Insufficient documentation

## 2012-06-16 DIAGNOSIS — M549 Dorsalgia, unspecified: Secondary | ICD-10-CM | POA: Insufficient documentation

## 2012-06-16 DIAGNOSIS — M542 Cervicalgia: Secondary | ICD-10-CM | POA: Insufficient documentation

## 2012-06-16 DIAGNOSIS — I10 Essential (primary) hypertension: Secondary | ICD-10-CM | POA: Insufficient documentation

## 2012-06-16 DIAGNOSIS — L259 Unspecified contact dermatitis, unspecified cause: Secondary | ICD-10-CM | POA: Insufficient documentation

## 2012-06-16 DIAGNOSIS — F17200 Nicotine dependence, unspecified, uncomplicated: Secondary | ICD-10-CM | POA: Insufficient documentation

## 2012-06-16 MED ORDER — TRIAMCINOLONE ACETONIDE 0.1 % EX CREA: TOPICAL_CREAM | Freq: Two times a day (BID) | CUTANEOUS | Status: DC

## 2012-06-16 MED ORDER — OXYCODONE-ACETAMINOPHEN 5-325 MG PO TABS
1.0000 | ORAL_TABLET | Freq: Once | ORAL | Status: AC
  Administered 2012-06-16: 1 via ORAL
  Filled 2012-06-16: qty 1

## 2012-06-16 MED ORDER — NAPROXEN 500 MG PO TABS: 500.0000 mg | ORAL_TABLET | Freq: Two times a day (BID) | ORAL | Status: DC

## 2012-06-16 NOTE — ED Provider Notes (Signed)
History     CSN: [id number]  Arrival date & time 06/16/12  1430   First MD Initiated Contact with Patient 06/16/12 1443      No chief complaint on file.   (Consider location/radiation/quality/duration/timing/severity/associated sxs/prior treatment) HPI Luis Wall is a 27 y.o. male who presents to ED with complaint of back pain. States pain for several years, worsened in the last few months. States pain starts in lower back, radiates up the back and down into both feet. Multiple visits for the same. Taking ibuprofen with no relief. States no PCP at this time. No medical problems. No loss of bowels. No urinary incontinence or retention. Denies IV drug use.    Past Medical History  Diagnosis Date  . Hypertension   . Anxiety     Past Surgical History  Procedure Laterality Date  . Tonsillectomy    . Wisdom tooth extraction      Family History  Problem Relation Age of Onset  . Hypertension Mother     History  Substance Use Topics  . Smoking status: Current Every Day Smoker    Types: Cigarettes  . Smokeless tobacco: Not on file  . Alcohol Use: Yes      Review of Systems  Constitutional: Negative for fever and chills.  HENT: Positive for neck pain. Negative for neck stiffness.   Respiratory: Negative.   Cardiovascular: Negative.   Gastrointestinal: Negative for nausea, vomiting and abdominal pain.  Genitourinary: Negative for flank pain.  Musculoskeletal: Positive for back pain.  Skin: Negative.   Neurological: Negative for weakness and numbness.    Allergies  Flexeril; Ibuprofen; Naproxen; Tramadol; and Vicodin  Home Medications   Current Outpatient Rx  Name  Route  Sig  Dispense  Refill  . ibuprofen (ADVIL,MOTRIN) 600 MG tablet   Oral   Take 600 mg by mouth every 4 (four) hours as needed for pain.         . naproxen (NAPROSYN) 375 MG tablet   Oral   Take 1 tablet (375 mg total) by mouth 2 (two) times daily as needed.   20 tablet   0     BP  153/85  Pulse 94  Temp(Src) 98 F (36.7 C) (Oral)  Resp 16  Wt 282 lb (127.914 kg)  BMI 37.21 kg/m2  SpO2 98%  Physical Exam  Nursing note and vitals reviewed. Constitutional: He appears well-developed and well-nourished. No distress.  Neck: Normal range of motion. Neck supple.  Cardiovascular: Normal rate, regular rhythm and normal heart sounds.   Pulmonary/Chest: Effort normal and breath sounds normal. No respiratory distress. He has no wheezes. He has no rales.  Musculoskeletal: Normal range of motion.  Midline and paravertebral lumbar spine tenderness. No pain with straight leg raise.   Neurological: He is alert.  5/5 and equal lower extremity strength bilaterally. Pt is able to dorsiflex bilateral feet and great toes. Patellar reflexes 2+ bilaterally  Skin: Skin is warm and dry.  Several grouped papula erythematous patches of rash to the left neck, anterior upper and lower left arm, anterior right forarm    ED Course  Procedures (including critical care time)  Labs Reviewed - No data to display No results found.   1. Back pain   2. Contact dermatitis       MDM  Pt with chronic back pain. No red flags suggesting cauda equina at this time. No new injuries. Had x-ray last month that was unremarkable. Ambulatory. Multiple visits here for the same. Given percocet  1 tab in ED. Home with naprosyn and follow up. Triamcinolone cream for the rash, suspect contact dermatitis. 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

## 2012-06-16 NOTE — Progress Notes (Signed)
[facility] has seen patient and provided her with an OC application. °

## 2012-06-16 NOTE — ED Notes (Signed)
Pt states he has a ride home

## 2012-06-16 NOTE — ED Notes (Signed)
Pt reports back pain x 2 weeks that is radiating down both legs. Denies recent fall or injury. Pt also has rash on left arm and neck that is most likely from poison ivy.

## 2012-06-16 NOTE — ED Provider Notes (Signed)
Medical screening examination/treatment/procedure(s) were performed by non-physician practitioner and as supervising physician I was immediately available for consultation/collaboration.   Lyanne Co, MD 06/16/12 [phone]

## 2012-07-22 ENCOUNTER — Emergency Department (HOSPITAL_COMMUNITY)
Admission: EM | Admit: 2012-07-22 | Discharge: 2012-07-22 | Disposition: A | Discharge status: Home / Self Care | Payer: Self-pay | Attending: Emergency Medicine | Admitting: Emergency Medicine

## 2012-07-22 ENCOUNTER — Encounter (HOSPITAL_COMMUNITY): Payer: Self-pay | Admitting: Emergency Medicine

## 2012-07-22 DIAGNOSIS — X503XXA Overexertion from repetitive movements, initial encounter: Secondary | ICD-10-CM | POA: Insufficient documentation

## 2012-07-22 DIAGNOSIS — F17200 Nicotine dependence, unspecified, uncomplicated: Secondary | ICD-10-CM | POA: Insufficient documentation

## 2012-07-22 DIAGNOSIS — Y9389 Activity, other specified: Secondary | ICD-10-CM | POA: Insufficient documentation

## 2012-07-22 DIAGNOSIS — Y9289 Other specified places as the place of occurrence of the external cause: Secondary | ICD-10-CM | POA: Insufficient documentation

## 2012-07-22 DIAGNOSIS — F411 Generalized anxiety disorder: Secondary | ICD-10-CM | POA: Insufficient documentation

## 2012-07-22 DIAGNOSIS — T148XXA Other injury of unspecified body region, initial encounter: Secondary | ICD-10-CM | POA: Insufficient documentation

## 2012-07-22 DIAGNOSIS — Y990 Civilian activity done for income or pay: Secondary | ICD-10-CM | POA: Insufficient documentation

## 2012-07-22 DIAGNOSIS — Z79899 Other long term (current) drug therapy: Secondary | ICD-10-CM | POA: Insufficient documentation

## 2012-07-22 DIAGNOSIS — I10 Essential (primary) hypertension: Secondary | ICD-10-CM | POA: Insufficient documentation

## 2012-07-22 MED ORDER — OXYCODONE-ACETAMINOPHEN 5-325 MG PO TABS: 1.0000 | ORAL_TABLET | Freq: Four times a day (QID) | ORAL | Status: DC | PRN

## 2012-07-22 MED ORDER — OXYCODONE-ACETAMINOPHEN 5-325 MG PO TABS
1.0000 | ORAL_TABLET | Freq: Once | ORAL | Status: AC
  Administered 2012-07-22: 1 via ORAL
  Filled 2012-07-22: qty 1

## 2012-07-22 MED ORDER — DIAZEPAM 5 MG PO TABS: 5.0000 mg | ORAL_TABLET | Freq: Three times a day (TID) | ORAL | Status: DC | PRN

## 2012-07-22 MED ORDER — DIAZEPAM 5 MG PO TABS
5.0000 mg | ORAL_TABLET | Freq: Once | ORAL | Status: AC
  Administered 2012-07-22: 5 mg via ORAL
  Filled 2012-07-22: qty 1

## 2012-07-22 NOTE — ED Notes (Signed)
WUJ:[id number]<JY> Expected date:07/22/12<BR> Expected time: 3:00 AM<BR> Means of arrival:Ambulance<BR> Comments:<BR> Back pain

## 2012-07-22 NOTE — ED Provider Notes (Signed)
History    CSN: [id number] Arrival date & time 07/22/12  0316  First MD Initiated Contact with Patient 07/22/12 [phone]     Chief Complaint  Patient presents with  . Shoulder Pain  . Back Pain   (Consider location/radiation/quality/duration/timing/severity/associated sxs/prior Treatment) HPI Comments: Patient states, that his job.  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  Tried over-the-counter ibuprofen, without any relief.  He also states, that intermittently he may get some anterior right thigh, numbness, which is not present.  At this time  Patient is a 27 y.o. male presenting with shoulder pain and back pain. The history is provided by the patient.  Shoulder Pain This is a new problem. The current episode started yesterday. The problem occurs constantly. The problem has been unchanged. Pertinent negatives include no chest pain, chills, fever, nausea, neck pain, numbness, rash or weakness. The symptoms are aggravated by exertion. He has tried NSAIDs for the symptoms. The treatment provided no relief.  Back Pain Associated symptoms: no chest pain, no fever, no numbness and no weakness    Past Medical History  Diagnosis Date  . Hypertension   . Anxiety    Past Surgical History  Procedure Laterality Date  . Tonsillectomy    . Wisdom tooth extraction     Family History  Problem Relation Age of Onset  . Hypertension Mother    History  Substance Use Topics  . Smoking status: Current Every Day Smoker    Types: Cigarettes  . Smokeless tobacco: Not on file  . Alcohol Use: Yes    Review of Systems  Unable to perform ROS Constitutional: Negative for fever and chills.  HENT: Negative for neck pain.   Respiratory: Negative for shortness of breath.   Cardiovascular: Negative for chest pain and leg swelling.  Gastrointestinal: Negative.  Negative for nausea.  Genitourinary: Negative.   Musculoskeletal: Positive for back  pain.  Skin: Negative for rash and wound.  Neurological: Negative for weakness and numbness.  All other systems reviewed and are negative.    Allergies  Flexeril; Ibuprofen; Naproxen; Tramadol; and Vicodin  Home Medications   Current Outpatient Rx  Name  Route  Sig  Dispense  Refill  . ibuprofen (ADVIL,MOTRIN) 200 MG tablet   Oral   Take 400 mg by mouth every 6 (six) hours as needed for pain.         . diazepam (VALIUM) 5 MG tablet   Oral   Take 1 tablet (5 mg total) by mouth every 8 (eight) hours as needed for anxiety.   9 tablet   0   . oxyCODONE-acetaminophen (PERCOCET/ROXICET) 5-325 MG per tablet   Oral   Take 1 tablet by mouth every 6 (six) hours as needed for pain.   11 tablet   0    BP 137/80  Pulse 80  Temp(Src) 98.8 F (37.1 C) (Oral)  Resp 18  SpO2 97% Physical Exam  Nursing note and vitals reviewed. Constitutional: He appears well-developed and well-nourished.  HENT:  Head: Normocephalic.  Neck: Normal range of motion.  Cardiovascular: Normal rate and regular rhythm.   Pulmonary/Chest: Effort normal and breath sounds normal.  Musculoskeletal: Normal range of motion. He exhibits tenderness. He exhibits no edema.       Back:  Neurological: He is alert.  Skin: Skin is warm. No rash noted. No erythema.    ED Course  Procedures (including critical care time) Labs Reviewed - No  data to display No results found. 1. Muscle strain     MDM   Patient is having muscle spasm in his right back from just below the scapula to the iliac crest.  Tender to touch.  We'll treat with by mouth Valium, and oxycodone.  Due to his multiple medical allergies  Arman Filter, NP 07/22/12 0445  Arman Filter, NP 07/22/12 [phone]

## 2012-07-22 NOTE — ED Provider Notes (Signed)
Medical screening examination/treatment/procedure(s) were performed by non-physician practitioner and as supervising physician I was immediately available for consultation/collaboration.  Sunnie Nielsen, MD 07/22/12 2303

## 2012-07-22 NOTE — ED Notes (Signed)
Brought in by EMS from home with c/o right shoulder pain and back pain mostly on the right side. Per EMS, pt works at a [employer] and has been lifting heavy objects, pt reports that he hurt himself from heavy-lifting--- has been having right shoulder pain and right-sided back pain since yesterday.

## 2012-07-28 ENCOUNTER — Emergency Department (HOSPITAL_COMMUNITY)
Admission: EM | Admit: 2012-07-28 | Discharge: 2012-07-28 | Disposition: A | Discharge status: Home / Self Care | Payer: Self-pay | Attending: Emergency Medicine | Admitting: Emergency Medicine

## 2012-07-28 ENCOUNTER — Encounter (HOSPITAL_COMMUNITY): Payer: Self-pay | Admitting: *Deleted

## 2012-07-28 DIAGNOSIS — F411 Generalized anxiety disorder: Secondary | ICD-10-CM | POA: Insufficient documentation

## 2012-07-28 DIAGNOSIS — F17200 Nicotine dependence, unspecified, uncomplicated: Secondary | ICD-10-CM | POA: Insufficient documentation

## 2012-07-28 DIAGNOSIS — M549 Dorsalgia, unspecified: Secondary | ICD-10-CM | POA: Insufficient documentation

## 2012-07-28 DIAGNOSIS — Z79899 Other long term (current) drug therapy: Secondary | ICD-10-CM | POA: Insufficient documentation

## 2012-07-28 DIAGNOSIS — I10 Essential (primary) hypertension: Secondary | ICD-10-CM | POA: Insufficient documentation

## 2012-07-28 MED ORDER — HYDROXYZINE HCL 25 MG PO TABS: 50.0000 mg | ORAL_TABLET | Freq: Three times a day (TID) | ORAL | Status: DC | PRN

## 2012-07-28 MED ORDER — OXYCODONE-ACETAMINOPHEN 5-325 MG PO TABS: 1.0000 | ORAL_TABLET | Freq: Every evening | ORAL | Status: DC | PRN

## 2012-07-28 MED ORDER — LORAZEPAM 1 MG PO TABS
1.0000 mg | ORAL_TABLET | Freq: Once | ORAL | Status: AC
  Administered 2012-07-28: 1 mg via ORAL
  Filled 2012-07-28: qty 1

## 2012-07-28 MED ORDER — FAMOTIDINE 20 MG PO TABS: 20.0000 mg | ORAL_TABLET | Freq: Two times a day (BID) | ORAL | Status: DC

## 2012-07-28 NOTE — ED Provider Notes (Signed)
History    CSN: [id number] Arrival date & time 07/28/12  4782  First MD Initiated Contact with Patient 07/28/12 0310     Chief Complaint  Patient presents with  . Back Pain   (Consider location/radiation/quality/duration/timing/severity/associated sxs/prior Treatment) HPI History as provided by the patient: Back pain ongoing and recurrent located in the mid back right sided. He was recently evaluated for the same. 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 He has a history of anxiety and states that he is having an anxiety attack tonight due to back pain and is requesting something for anxiety. Percocet also seems to help his pain. He has scheduled followup but can't remember who his appointment is with. He denies any weakness or numbness. He denies any new symptoms. Pain is sharp in quality and worse with movement.  Past Medical History  Diagnosis Date  . Hypertension   . Anxiety    Past Surgical History  Procedure Laterality Date  . Tonsillectomy    . Wisdom tooth extraction     Family History  Problem Relation Age of Onset  . Hypertension Mother    History  Substance Use Topics  . Smoking status: Current Every Day Smoker    Types: Cigarettes  . Smokeless tobacco: Not on file  . Alcohol Use: Yes    Review of Systems  Constitutional: Negative for fever and chills.  HENT: Negative for neck pain and neck stiffness.   Eyes: Negative for visual disturbance.  Respiratory: Negative for shortness of breath.   Cardiovascular: Negative for chest pain.  Gastrointestinal: Negative for abdominal pain.  Genitourinary: Negative for flank pain.  Musculoskeletal: Positive for back pain.  Skin: Negative for rash.  Neurological: Negative for headaches.  All other systems reviewed and are negative.    Allergies  Flexeril; Ibuprofen; Naproxen; Tramadol; and Vicodin  Home Medications   Current Outpatient  Rx  Name  Route  Sig  Dispense  Refill  . diazepam (VALIUM) 5 MG tablet   Oral   Take 1 tablet (5 mg total) by mouth every 8 (eight) hours as needed for anxiety.   9 tablet   0   . oxyCODONE-acetaminophen (PERCOCET/ROXICET) 5-325 MG per tablet   Oral   Take 1 tablet by mouth every 6 (six) hours as needed for pain.   11 tablet   0   . ibuprofen (ADVIL,MOTRIN) 200 MG tablet   Oral   Take 400 mg by mouth every 6 (six) hours as needed for pain.          BP 141/97  Pulse 86  Temp(Src) 99 F (37.2 C) (Oral)  Resp 20  SpO2 97% Physical Exam  Constitutional: He is oriented to person, place, and time. He appears well-developed and well-nourished.  HENT:  Head: Normocephalic and atraumatic.  Eyes: Conjunctivae and EOM are normal. Pupils are equal, round, and reactive to light.  Neck: Full passive range of motion without pain. Neck supple. No thyromegaly present.  No C-spine tenderness or deformity  Cardiovascular: Normal rate, regular rhythm, S1 normal, S2 normal and intact distal pulses.   Pulmonary/Chest: Effort normal and breath sounds normal. He exhibits no tenderness.  Abdominal: Soft. Bowel sounds are normal. There is no tenderness. There is no CVA tenderness.  Musculoskeletal: Normal range of motion. He exhibits no edema.  Mid right parathoracic point tenderness without midline tenderness or deformity. No upper extremity or lower extremity deficits with equal  strengths - grips, biceps, triceps, dorsi plantar flexion and knee extension.   Neurological: He is alert and oriented to person, place, and time. He has normal strength and normal reflexes. No cranial nerve deficit or sensory deficit. He displays a negative Romberg sign. GCS eye subscore is 4. GCS verbal subscore is 5. GCS motor subscore is 6.  Normal Gait  Skin: Skin is warm and dry. No rash noted. No cyanosis. Nails show no clubbing.  Psychiatric: He has a normal mood and affect. His speech is normal and behavior is  normal.    ED Course  Procedures (including critical care time)  Ativan for anxiety  Recurrent musculoskeletal back pain. Outpatient referral provided. Back pain precautions provided and verbalized as understood. There is no indication for imaging at this time  MDM  Back pain in the setting of strenuous activity, weight lifting and roofing  Records reviewed, recent visit for the same.  Vital signs and nursing notes reviewed and considered  Sunnie Nielsen, MD 07/28/12 [phone]

## 2012-07-28 NOTE — ED Notes (Signed)
Pt states he has back pain 10/10 says he was seen here last week,  And the pain has just gotten worse and it is making his anxiety really bad

## 2012-10-03 ENCOUNTER — Encounter (HOSPITAL_COMMUNITY): Payer: Self-pay | Admitting: *Deleted

## 2012-10-03 ENCOUNTER — Emergency Department (HOSPITAL_COMMUNITY)
Admission: EM | Admit: 2012-10-03 | Discharge: 2012-10-03 | Disposition: A | Discharge status: Home / Self Care | Payer: No Typology Code available for payment source | Attending: Emergency Medicine | Admitting: Emergency Medicine

## 2012-10-03 DIAGNOSIS — X58XXXA Exposure to other specified factors, initial encounter: Secondary | ICD-10-CM | POA: Insufficient documentation

## 2012-10-03 DIAGNOSIS — Y9289 Other specified places as the place of occurrence of the external cause: Secondary | ICD-10-CM | POA: Insufficient documentation

## 2012-10-03 DIAGNOSIS — Z8659 Personal history of other mental and behavioral disorders: Secondary | ICD-10-CM | POA: Insufficient documentation

## 2012-10-03 DIAGNOSIS — F17200 Nicotine dependence, unspecified, uncomplicated: Secondary | ICD-10-CM | POA: Insufficient documentation

## 2012-10-03 DIAGNOSIS — K0889 Other specified disorders of teeth and supporting structures: Secondary | ICD-10-CM

## 2012-10-03 DIAGNOSIS — K08109 Complete loss of teeth, unspecified cause, unspecified class: Secondary | ICD-10-CM | POA: Insufficient documentation

## 2012-10-03 DIAGNOSIS — S025XXA Fracture of tooth (traumatic), initial encounter for closed fracture: Secondary | ICD-10-CM | POA: Insufficient documentation

## 2012-10-03 DIAGNOSIS — I10 Essential (primary) hypertension: Secondary | ICD-10-CM | POA: Insufficient documentation

## 2012-10-03 DIAGNOSIS — Y9389 Activity, other specified: Secondary | ICD-10-CM | POA: Insufficient documentation

## 2012-10-03 MED ORDER — ONDANSETRON HCL 4 MG PO TABS: 4.0000 mg | ORAL_TABLET | Freq: Four times a day (QID) | ORAL | Status: DC

## 2012-10-03 MED ORDER — PENICILLIN V POTASSIUM 500 MG PO TABS: 500.0000 mg | ORAL_TABLET | Freq: Four times a day (QID) | ORAL | Status: AC

## 2012-10-03 MED ORDER — MORPHINE SULFATE 4 MG/ML IJ SOLN
6.0000 mg | Freq: Once | INTRAMUSCULAR | Status: AC
  Administered 2012-10-03: 6 mg via INTRAMUSCULAR
  Filled 2012-10-03: qty 2

## 2012-10-03 NOTE — ED Notes (Signed)
Pt denies trouble swallowing but pain increases upon palpation to the tooth.

## 2012-10-03 NOTE — ED Provider Notes (Signed)
CSN: [id number]     Arrival date & time 10/03/12  0133 History   First MD Initiated Contact with Patient 10/03/12 [phone]     Chief Complaint  Patient presents with  . Dental Pain   (Consider location/radiation/quality/duration/timing/severity/associated sxs/prior Treatment) Patient is a 27 y.o. male presenting with tooth pain.  Dental Pain Location:  Lower Lower teeth location:  25/RL central incisor Quality:  Aching Severity:  Severe Onset quality:  Gradual Duration:  1 week Timing:  Constant Progression:  Unchanged Context: dental fracture   Worsened by:  Cold food/drink, hot food/drink and touching Ineffective treatments:  NSAIDs Associated symptoms: no congestion, no facial pain, no facial swelling, no fever, no gum swelling, no headaches, no oral bleeding, no oral lesions and no trismus   Risk factors: periodontal disease     Past Medical History  Diagnosis Date  . Hypertension   . Anxiety    Past Surgical History  Procedure Laterality Date  . Tonsillectomy    . Wisdom tooth extraction     Family History  Problem Relation Age of Onset  . Hypertension Mother    History  Substance Use Topics  . Smoking status: Current Every Day Smoker -- 0.25 packs/day    Types: Cigarettes  . Smokeless tobacco: Not on file  . Alcohol Use: Yes    Review of Systems  Constitutional: Negative for fever, activity change, appetite change and fatigue.  HENT: Negative for congestion, facial swelling, rhinorrhea, mouth sores and trouble swallowing.   Eyes: Negative for photophobia and pain.  Respiratory: Negative for cough, chest tightness and shortness of breath.   Cardiovascular: Negative for chest pain and leg swelling.  Gastrointestinal: Negative for nausea, vomiting, abdominal pain, diarrhea and constipation.  Endocrine: Negative for polydipsia and polyuria.  Genitourinary: Negative for dysuria, urgency, decreased urine volume and difficulty urinating.  Musculoskeletal: Negative  for back pain and gait problem.  Skin: Negative for color change, rash and wound.  Allergic/Immunologic: Negative for immunocompromised state.  Neurological: Negative for dizziness, facial asymmetry, speech difficulty, weakness, numbness and headaches.  Psychiatric/Behavioral: Negative for confusion, decreased concentration and agitation.    Allergies  Flexeril; Ibuprofen; Naproxen; Tramadol; and Vicodin  Home Medications   Current Outpatient Rx  Name  Route  Sig  Dispense  Refill  . ibuprofen (ADVIL,MOTRIN) 200 MG tablet   Oral   Take 400 mg by mouth every 6 (six) hours as needed for pain.         Marland Kitchen ondansetron (ZOFRAN) 4 MG tablet   Oral   Take 1 tablet (4 mg total) by mouth every 6 (six) hours.   12 tablet   0   . penicillin v potassium (VEETID) 500 MG tablet   Oral   Take 1 tablet (500 mg total) by mouth 4 (four) times daily.   40 tablet   0    BP 123/66  Pulse 90  Temp(Src) 98.4 F (36.9 C) (Oral)  Resp 16  Ht 6\' 1"  (1.854 m)  Wt 275 lb (124.739 kg)  BMI 36.29 kg/m2  SpO2 99% Physical Exam  Constitutional: He is oriented to person, place, and time. He appears well-developed and well-nourished. No distress.  HENT:  Head: Normocephalic and atraumatic.  Mouth/Throat: No oropharyngeal exudate.    Eyes: Pupils are equal, round, and reactive to light.  Neck: Normal range of motion. Neck supple.  Cardiovascular: Normal rate, regular rhythm and normal heart sounds.  Exam reveals no gallop and no friction rub.   No murmur heard.  Pulmonary/Chest: Effort normal and breath sounds normal. No respiratory distress. He has no wheezes. He has no rales.  Abdominal: Soft. Bowel sounds are normal. He exhibits no distension and no mass. There is no tenderness. There is no rebound and no guarding.  Musculoskeletal: Normal range of motion. He exhibits no edema and no tenderness.  Neurological: He is alert and oriented to person, place, and time.  Skin: Skin is warm and dry.    Psychiatric: He has a normal mood and affect.    ED Course  Procedures (including critical care time) Labs Review Labs Reviewed - No data to display Imaging Review No results found.  MDM   Pt is a 27 y.o. male with Pmhx as above who presents with dental pain at tooth 25 for about 1 week after he cracked tooth while eating a hamburger. On PE, pt has fracture at distal tooth involving enamel only, though there is crack extending down towards root of posterior side of tooth. Pt has declined dental block.  Will place on pen vk for possible developing periapical abscess and as him to f/u with dentistry.  Return precautions given for new or worsening symptoms  1. Pain, dental          Shanna Cisco, MD 10/03/12 [phone]

## 2012-10-03 NOTE — ED Notes (Signed)
Bed: WA10 Expected date:  Expected time:  Means of arrival:  Comments: EMS 

## 2012-10-03 NOTE — ED Notes (Signed)
Per EMS report: Pt c/o of dental pain in the bottom, central tooth.  Pt reports a crack in the tooth. Pt has had this pain for about a week. Pt a/o x 4.  Skin warm and dry.

## 2012-10-03 NOTE — ED Notes (Signed)
Dr. Docherty at bedside.

## 2012-10-03 NOTE — ED Notes (Signed)
Pt given a phone to try to find a ride.

## 2012-10-12 ENCOUNTER — Emergency Department (HOSPITAL_COMMUNITY): Payer: Self-pay

## 2012-10-12 ENCOUNTER — Encounter (HOSPITAL_COMMUNITY): Payer: Self-pay | Admitting: Emergency Medicine

## 2012-10-12 ENCOUNTER — Emergency Department (HOSPITAL_COMMUNITY)
Admission: EM | Admit: 2012-10-12 | Discharge: 2012-10-12 | Disposition: A | Discharge status: Home / Self Care | Payer: Self-pay | Attending: Emergency Medicine | Admitting: Emergency Medicine

## 2012-10-12 DIAGNOSIS — F411 Generalized anxiety disorder: Secondary | ICD-10-CM | POA: Insufficient documentation

## 2012-10-12 DIAGNOSIS — W010XXA Fall on same level from slipping, tripping and stumbling without subsequent striking against object, initial encounter: Secondary | ICD-10-CM | POA: Insufficient documentation

## 2012-10-12 DIAGNOSIS — Y92009 Unspecified place in unspecified non-institutional (private) residence as the place of occurrence of the external cause: Secondary | ICD-10-CM | POA: Insufficient documentation

## 2012-10-12 DIAGNOSIS — M7918 Myalgia, other site: Secondary | ICD-10-CM

## 2012-10-12 DIAGNOSIS — Y93E1 Activity, personal bathing and showering: Secondary | ICD-10-CM | POA: Insufficient documentation

## 2012-10-12 DIAGNOSIS — W19XXXA Unspecified fall, initial encounter: Secondary | ICD-10-CM

## 2012-10-12 DIAGNOSIS — M546 Pain in thoracic spine: Secondary | ICD-10-CM | POA: Insufficient documentation

## 2012-10-12 DIAGNOSIS — F17200 Nicotine dependence, unspecified, uncomplicated: Secondary | ICD-10-CM | POA: Insufficient documentation

## 2012-10-12 DIAGNOSIS — I10 Essential (primary) hypertension: Secondary | ICD-10-CM | POA: Insufficient documentation

## 2012-10-12 DIAGNOSIS — M25569 Pain in unspecified knee: Secondary | ICD-10-CM | POA: Insufficient documentation

## 2012-10-12 MED ORDER — METHOCARBAMOL 500 MG PO TABS: 500.0000 mg | ORAL_TABLET | Freq: Two times a day (BID) | ORAL | Status: DC | PRN

## 2012-10-12 MED ORDER — OXYCODONE-ACETAMINOPHEN 5-325 MG PO TABS
1.0000 | ORAL_TABLET | Freq: Once | ORAL | Status: AC
  Administered 2012-10-12: 1 via ORAL
  Filled 2012-10-12: qty 1

## 2012-10-12 NOTE — ED Notes (Signed)
Pt arrived via EMS with a complaint of back pain as a result of a fall. Pt states he slipped in the tub and landed in the mid upper back on the edge of the tub.  Pt states that he had no LOC.  Pt states that the pain is in the upper mid back upon palpation.

## 2012-10-12 NOTE — ED Notes (Signed)
Bed: EA54 Expected date:  Expected time:  Means of arrival:  Comments: Bed 10, EMS, 26 M, Fall

## 2012-10-12 NOTE — ED Provider Notes (Signed)
Medical screening examination/treatment/procedure(s) were performed by non-physician practitioner and as supervising physician I was immediately available for consultation/collaboration.  Jasmine Awe, MD 10/12/12 [phone]

## 2012-10-12 NOTE — ED Notes (Signed)
Warm compress applied to back

## 2012-10-12 NOTE — ED Notes (Signed)
Patient transported to X-ray 

## 2012-10-12 NOTE — ED Provider Notes (Signed)
CSN: [id number]     Arrival date & time 10/12/12  6578 History   First MD Initiated Contact with Patient 10/12/12 0606     Chief Complaint  Patient presents with  . Fall  . Back Pain   (Consider location/radiation/quality/duration/timing/severity/associated sxs/prior Treatment) HPI  Luis Wall is a 27 y.o. male brought in by EMS for thoracic back pain status post slip and fall this morning in the shower. Patient denies head trauma, LOC, nausea vomiting, cervicalgia, chest pain, shortness of breath, abdominal pain. He does endorse a left knee pain, denies numbness, weakness, paresthesias. Denies any sedating prescription drug use, alcohol or illicit drug use  Past Medical History  Diagnosis Date  . Hypertension   . Anxiety    Past Surgical History  Procedure Laterality Date  . Tonsillectomy    . Wisdom tooth extraction     Family History  Problem Relation Age of Onset  . Hypertension Mother    History  Substance Use Topics  . Smoking status: Current Every Day Smoker -- 0.25 packs/day    Types: Cigarettes  . Smokeless tobacco: Not on file  . Alcohol Use: Yes    Review of Systems 10 systems reviewed and found to be negative, except as noted in the HPI   Allergies  Flexeril; Ibuprofen; Naproxen; Tramadol; and Vicodin  Home Medications   Current Outpatient Rx  Name  Route  Sig  Dispense  Refill  . ibuprofen (ADVIL,MOTRIN) 200 MG tablet   Oral   Take 400 mg by mouth every 6 (six) hours as needed for pain.          BP 147/83  Pulse 86  Temp(Src) 98.1 F (36.7 C) (Oral)  Resp 14  SpO2 100% Physical Exam  Nursing note and vitals reviewed. Constitutional: He is oriented to person, place, and time. He appears well-developed and well-nourished. No distress.    HENT:  Head: Normocephalic.  Mouth/Throat: Oropharynx is clear and moist.  Eyes: Conjunctivae and EOM are normal. Pupils are equal, round, and reactive to light.  Neck: Normal range of motion.  No  midline tenderness to palpation or step-offs appreciated. Patient has full range of motion without pain.   Cardiovascular: Normal rate, regular rhythm and intact distal pulses.   Pulmonary/Chest: Effort normal and breath sounds normal. No stridor. No respiratory distress. He has no wheezes. He has no rales. He exhibits no tenderness.  Abdominal: Soft. Bowel sounds are normal. He exhibits no distension and no mass. There is no tenderness. There is no rebound and no guarding.  Musculoskeletal: Normal range of motion. He exhibits no edema and no tenderness.       Legs: Left knee: No deformity, erythema or abrasions. FROM. No effusion or crepitance. Anterior and posterior drawer show no abnormal laxity. Stable to valgus and varus stress. Joint lines are non-tender, however patient is tender in the far lateral side.  Neurovascularly intact. Pt ambulates with non-antalgic gait.    Neurological: He is alert and oriented to person, place, and time.  Follows commands, Goal oriented speech, Strength is 5 out of 5x4 extremities, patient ambulates with a coordinated in nonantalgic gait. Sensation is grossly intact.   Psychiatric: He has a normal mood and affect.    ED Course  Procedures (including critical care time) Labs Review Labs Reviewed - No data to display Imaging Review No results found.  MDM  No diagnosis found. Filed Vitals:   10/12/12 0522 10/12/12 0752  BP: 147/83 127/80  Pulse: 86  Temp: 98.1 F (36.7 C) 98.2 F (36.8 C)  TempSrc: Oral Oral  Resp: 14 18  SpO2: 100% 98%     Luis Wall is a 27 y.o. male thoracic back pain and left knee pain status post slip and fall in the shower this a.m. C-spine cleared by nexus criteria. Plain films are negative.   Medications  oxyCODONE-acetaminophen (PERCOCET/ROXICET) 5-325 MG per tablet 1 tablet (1 tablet Oral Given 10/12/12 0642)    Pt is hemodynamically stable, appropriate for, and amenable to discharge at this time. Pt verbalized  understanding and agrees with care plan. All questions answered. Outpatient follow-up and specific return precautions discussed.    New Prescriptions   METHOCARBAMOL (ROBAXIN) 500 MG TABLET    Take 1 tablet (500 mg total) by mouth 2 (two) times daily as needed.    Note: Portions of this report may have been transcribed using voice recognition software. Every effort was made to ensure accuracy; however, inadvertent computerized transcription errors may be present     Wynetta Emery, PA-C 10/12/12 [phone]

## 2012-10-13 ENCOUNTER — Encounter (HOSPITAL_COMMUNITY): Payer: Self-pay | Admitting: Family Medicine

## 2012-10-13 ENCOUNTER — Emergency Department (HOSPITAL_COMMUNITY)
Admission: EM | Admit: 2012-10-13 | Discharge: 2012-10-13 | Disposition: A | Discharge status: Home / Self Care | Payer: Self-pay | Attending: Emergency Medicine | Admitting: Emergency Medicine

## 2012-10-13 DIAGNOSIS — F411 Generalized anxiety disorder: Secondary | ICD-10-CM | POA: Insufficient documentation

## 2012-10-13 DIAGNOSIS — F17200 Nicotine dependence, unspecified, uncomplicated: Secondary | ICD-10-CM | POA: Insufficient documentation

## 2012-10-13 DIAGNOSIS — I10 Essential (primary) hypertension: Secondary | ICD-10-CM | POA: Insufficient documentation

## 2012-10-13 DIAGNOSIS — M549 Dorsalgia, unspecified: Secondary | ICD-10-CM | POA: Insufficient documentation

## 2012-10-13 DIAGNOSIS — F1110 Opioid abuse, uncomplicated: Secondary | ICD-10-CM | POA: Insufficient documentation

## 2012-10-13 LAB — COMPREHENSIVE METABOLIC PANEL
AST: 26 U/L (ref 0–37)
Albumin: 4.2 g/dL (ref 3.5–5.2)
Alkaline Phosphatase: 60 U/L (ref 39–117)
BUN: 10 mg/dL (ref 6–23)
Potassium: 3.6 mEq/L (ref 3.5–5.1)
Total Protein: 7.4 g/dL (ref 6.0–8.3)

## 2012-10-13 LAB — ETHANOL: Alcohol, Ethyl (B): <11 mg/dL (ref 0–11)

## 2012-10-13 LAB — RAPID URINE DRUG SCREEN, HOSP PERFORMED
Barbiturates: NOT DETECTED
Benzodiazepines: NOT DETECTED
Cocaine: NOT DETECTED

## 2012-10-13 LAB — CBC
HCT: 46.9 % (ref 39.0–52.0)
MCHC: 34.5 g/dL (ref 30.0–36.0)
Platelets: 209 10*3/uL (ref 150–400)
RDW: 13.1 % (ref 11.5–15.5)

## 2012-10-13 LAB — ACETAMINOPHEN LEVEL: Acetaminophen (Tylenol), Serum: <15 ug/mL (ref 10–30)

## 2012-10-13 LAB — SALICYLATE LEVEL: Salicylate Lvl: <2 mg/dL — ABNORMAL LOW (ref 2.8–20.0)

## 2012-10-13 MED ORDER — LORAZEPAM 1 MG PO TABS: 1.0000 mg | ORAL_TABLET | Freq: Three times a day (TID) | ORAL | Status: DC | PRN

## 2012-10-13 MED ORDER — CLONIDINE HCL 0.2 MG PO TABS: 0.1000 mg | ORAL_TABLET | Freq: Two times a day (BID) | ORAL | Status: DC

## 2012-10-13 NOTE — ED Notes (Signed)
Pick[place], MD at bedside.  

## 2012-10-13 NOTE — ED Notes (Addendum)
Pt belongings consist of: cigerette lighter, sneakers, tshirt, boxers, jeans, and socks.

## 2012-10-13 NOTE — ED Provider Notes (Signed)
CSN: [id number]     Arrival date & time 10/13/12  0054 History   First MD Initiated Contact with Patient 10/13/12 [phone]     Chief Complaint  Patient presents with  . Medical Clearance   (Consider location/radiation/quality/duration/timing/severity/associated sxs/prior Treatment) The history is provided by the patient.   patient presents requesting treatment for opioid abuse. States she was used around 8 hydrocodone tabs a day. He's been using for a year and half. States the longest she has gone without using it around 2 days. He denies depression but states he gets anxiety when he tries to come off it. He denies other substance abuse. He denies injecting drugs. He states he has had some back pain. He states he started by hanging out with the wrong crowd.  Past Medical History  Diagnosis Date  . Hypertension   . Anxiety    Past Surgical History  Procedure Laterality Date  . Tonsillectomy    . Wisdom tooth extraction     Family History  Problem Relation Age of Onset  . Hypertension Mother    History  Substance Use Topics  . Smoking status: Current Every Day Smoker -- 0.25 packs/day    Types: Cigarettes  . Smokeless tobacco: Not on file  . Alcohol Use: Yes    Review of Systems  Constitutional: Negative for activity change and appetite change.  HENT: Negative for neck stiffness.   Eyes: Negative for pain.  Respiratory: Negative for chest tightness and shortness of breath.   Cardiovascular: Negative for chest pain and leg swelling.  Gastrointestinal: Negative for nausea, vomiting, abdominal pain and diarrhea.  Genitourinary: Negative for flank pain.  Musculoskeletal: Negative for back pain.  Skin: Negative for rash.  Neurological: Negative for weakness, numbness and headaches.  Psychiatric/Behavioral: Negative for behavioral problems. The patient is nervous/anxious.     Allergies  Flexeril; Ibuprofen; Naproxen; Tramadol; and Vicodin  Home Medications   Current Outpatient  Rx  Name  Route  Sig  Dispense  Refill  . cloNIDine (CATAPRES) 0.2 MG tablet   Oral   Take 0.5 tablets (0.1 mg total) by mouth 2 (two) times daily.   10 tablet   0   . ibuprofen (ADVIL,MOTRIN) 200 MG tablet   Oral   Take 400 mg by mouth every 6 (six) hours as needed for pain.         Marland Kitchen LORazepam (ATIVAN) 1 MG tablet   Oral   Take 1 tablet (1 mg total) by mouth 3 (three) times daily as needed for anxiety.   6 tablet   0   . methocarbamol (ROBAXIN) 500 MG tablet   Oral   Take 1 tablet (500 mg total) by mouth 2 (two) times daily as needed.   8 tablet   0    BP 140/98  Pulse 104  Temp(Src) 98.5 F (36.9 C) (Oral)  Resp 20  Ht 6\' 1"  (1.854 m)  Wt 266 lb 8 oz (120.884 kg)  BMI 35.17 kg/m2  SpO2 98% Physical Exam  Nursing note and vitals reviewed. Constitutional: He is oriented to person, place, and time. He appears well-developed and well-nourished.  HENT:  Head: Normocephalic and atraumatic.  Eyes: EOM are normal. Pupils are equal, round, and reactive to light.  Neck: Normal range of motion. Neck supple.  Cardiovascular: Normal rate, regular rhythm and normal heart sounds.   No murmur heard. Pulmonary/Chest: Effort normal and breath sounds normal.  Abdominal: Soft. Bowel sounds are normal. He exhibits no distension and no mass.  There is no tenderness. There is no rebound and no guarding.  Musculoskeletal: Normal range of motion. He exhibits no edema.  Neurological: He is alert and oriented to person, place, and time. No cranial nerve deficit.  Skin: Skin is warm and dry.  Psychiatric: He has a normal mood and affect.    ED Course  Procedures (including critical care time) Labs Review Labs Reviewed  SALICYLATE LEVEL - Abnormal; Notable for the following:    Salicylate Lvl <2.0 (*)    All other components within normal limits  URINE RAPID DRUG SCREEN (HOSP PERFORMED) - Abnormal; Notable for the following:    Tetrahydrocannabinol POSITIVE (*)    All other  components within normal limits  ACETAMINOPHEN LEVEL  CBC  COMPREHENSIVE METABOLIC PANEL  ETHANOL   Imaging Review Dg Thoracic Spine 4v  10/12/2012   *RADIOLOGY REPORT*  Clinical Data: Fall.  Lower thoracic pain.  THORACIC SPINE - 4+ VIEW  Comparison: May 05, 2012.  Findings: No fracture or malalignment.  IMPRESSION: No fracture.   Original Report Authenticated By: Lacy Duverney, M.D.   Dg Knee Complete 4 Views Left  10/12/2012   *RADIOLOGY REPORT*  Clinical Data: Fall.  Lateral pain.  LEFT KNEE - COMPLETE 4+ VIEW  Comparison: None.  Findings: No fracture or dislocation. Lucency proximal fibula felt to represent normal trabeculations.  Undulation tibia on lateral view felt to be of normal.  IMPRESSION: No fracture.   Original Report Authenticated By: Lacy Duverney, M.D.    MDM   1. Opioid abuse    Patient with opioid abuse. Discussed with TTS. No available beds for now. Patient was given resource guide and Ativan with Catapres. Will followup as an outpatient.    Juliet Rude. Rubin Payor, MD 10/13/12 [phone]

## 2012-10-13 NOTE — ED Notes (Signed)
Patient states "I have a very bad pill addiction. I take 8-10 Vicodin and Oxycodone a day. I'm too young for this." States he lasted used Percocet this afternoon.

## 2012-10-17 ENCOUNTER — Encounter (HOSPITAL_COMMUNITY): Payer: Self-pay | Admitting: Emergency Medicine

## 2012-10-17 ENCOUNTER — Emergency Department (HOSPITAL_COMMUNITY)
Admission: EM | Admit: 2012-10-17 | Discharge: 2012-10-17 | Disposition: A | Discharge status: Home / Self Care | Payer: Self-pay | Attending: Emergency Medicine | Admitting: Emergency Medicine

## 2012-10-17 DIAGNOSIS — F329 Major depressive disorder, single episode, unspecified: Secondary | ICD-10-CM | POA: Insufficient documentation

## 2012-10-17 DIAGNOSIS — Z79899 Other long term (current) drug therapy: Secondary | ICD-10-CM | POA: Insufficient documentation

## 2012-10-17 DIAGNOSIS — F411 Generalized anxiety disorder: Secondary | ICD-10-CM | POA: Insufficient documentation

## 2012-10-17 DIAGNOSIS — F1910 Other psychoactive substance abuse, uncomplicated: Secondary | ICD-10-CM | POA: Insufficient documentation

## 2012-10-17 DIAGNOSIS — I10 Essential (primary) hypertension: Secondary | ICD-10-CM | POA: Insufficient documentation

## 2012-10-17 DIAGNOSIS — F1920 Other psychoactive substance dependence, uncomplicated: Secondary | ICD-10-CM | POA: Diagnosis present

## 2012-10-17 DIAGNOSIS — F3289 Other specified depressive episodes: Secondary | ICD-10-CM | POA: Insufficient documentation

## 2012-10-17 DIAGNOSIS — F419 Anxiety disorder, unspecified: Secondary | ICD-10-CM

## 2012-10-17 DIAGNOSIS — F17200 Nicotine dependence, unspecified, uncomplicated: Secondary | ICD-10-CM | POA: Insufficient documentation

## 2012-10-17 LAB — RAPID URINE DRUG SCREEN, HOSP PERFORMED
Cocaine: NOT DETECTED
Opiates: NOT DETECTED

## 2012-10-17 LAB — CBC
HCT: 45.2 % (ref 39.0–52.0)
Hemoglobin: 15.6 g/dL (ref 13.0–17.0)
MCH: 27.6 pg (ref 26.0–34.0)
MCHC: 34.5 g/dL (ref 30.0–36.0)

## 2012-10-17 LAB — ETHANOL: Alcohol, Ethyl (B): 235 mg/dL — ABNORMAL HIGH (ref 0–11)

## 2012-10-17 LAB — COMPREHENSIVE METABOLIC PANEL
BUN: 9 mg/dL (ref 6–23)
Calcium: 9.2 mg/dL (ref 8.4–10.5)
GFR calc Af Amer: >90 mL/min (ref >90)
Glucose, Bld: 98 mg/dL (ref 70–99)
Total Protein: 7.4 g/dL (ref 6.0–8.3)

## 2012-10-17 MED ORDER — ONDANSETRON 4 MG PO TBDP: 4.0000 mg | ORAL_TABLET | Freq: Four times a day (QID) | ORAL | Status: DC | PRN

## 2012-10-17 MED ORDER — NICOTINE 21 MG/24HR TD PT24: 21.0000 mg | MEDICATED_PATCH | Freq: Every day | TRANSDERMAL | Status: DC

## 2012-10-17 MED ORDER — THIAMINE HCL 100 MG/ML IJ SOLN: 100.0000 mg | Freq: Every day | INTRAMUSCULAR | Status: DC

## 2012-10-17 MED ORDER — VITAMIN B-1 100 MG PO TABS: 100.0000 mg | ORAL_TABLET | Freq: Every day | ORAL | Status: DC

## 2012-10-17 MED ORDER — CLONIDINE HCL 0.1 MG PO TABS: 0.1000 mg | ORAL_TABLET | Freq: Four times a day (QID) | ORAL | Status: DC

## 2012-10-17 MED ORDER — LOPERAMIDE HCL 2 MG PO CAPS: 2.0000 mg | ORAL_CAPSULE | ORAL | Status: DC | PRN

## 2012-10-17 MED ORDER — DICYCLOMINE HCL 20 MG PO TABS: 20.0000 mg | ORAL_TABLET | Freq: Four times a day (QID) | ORAL | Status: DC | PRN

## 2012-10-17 MED ORDER — ACETAMINOPHEN 325 MG PO TABS: 650.0000 mg | ORAL_TABLET | ORAL | Status: DC | PRN

## 2012-10-17 MED ORDER — LORAZEPAM 1 MG PO TABS: 0.0000 mg | ORAL_TABLET | Freq: Two times a day (BID) | ORAL | Status: DC

## 2012-10-17 MED ORDER — LORAZEPAM 1 MG PO TABS: 1.0000 mg | ORAL_TABLET | Freq: Four times a day (QID) | ORAL | Status: DC | PRN

## 2012-10-17 MED ORDER — FOLIC ACID 1 MG PO TABS: 1.0000 mg | ORAL_TABLET | Freq: Every day | ORAL | Status: DC

## 2012-10-17 MED ORDER — ALUM & MAG HYDROXIDE-SIMETH 200-200-20 MG/5ML PO SUSP: 30.0000 mL | ORAL | Status: DC | PRN

## 2012-10-17 MED ORDER — METHOCARBAMOL 500 MG PO TABS: 500.0000 mg | ORAL_TABLET | Freq: Three times a day (TID) | ORAL | Status: DC | PRN

## 2012-10-17 MED ORDER — HYDROXYZINE HCL 25 MG PO TABS: 25.0000 mg | ORAL_TABLET | Freq: Four times a day (QID) | ORAL | Status: DC | PRN

## 2012-10-17 MED ORDER — LORAZEPAM 1 MG PO TABS: 0.0000 mg | ORAL_TABLET | Freq: Four times a day (QID) | ORAL | Status: DC

## 2012-10-17 MED ORDER — ONDANSETRON HCL 4 MG PO TABS: 4.0000 mg | ORAL_TABLET | Freq: Three times a day (TID) | ORAL | Status: DC | PRN

## 2012-10-17 MED ORDER — CLONIDINE HCL 0.1 MG PO TABS: 0.1000 mg | ORAL_TABLET | ORAL | Status: DC

## 2012-10-17 MED ORDER — CLONIDINE HCL 0.1 MG PO TABS: 0.1000 mg | ORAL_TABLET | Freq: Every day | ORAL | Status: DC

## 2012-10-17 MED ORDER — ADULT MULTIVITAMIN W/MINERALS CH: 1.0000 | ORAL_TABLET | Freq: Every day | ORAL | Status: DC

## 2012-10-17 MED ORDER — LORAZEPAM 2 MG/ML IJ SOLN: 1.0000 mg | Freq: Four times a day (QID) | INTRAMUSCULAR | Status: DC | PRN

## 2012-10-17 NOTE — ED Provider Notes (Addendum)
CSN: [id number]     Arrival date & time 10/17/12  0051 History   First MD Initiated Contact with Patient 10/17/12 0116     Chief Complaint  Patient presents with  . Medical Clearance   (Consider location/radiation/quality/duration/timing/severity/associated sxs/prior Treatment) HPI 27 year old male presents to emergency department with report of severe depression and anxiety, also polysubstance abuse.  Patient reports he was in the emergency department on Tuesday looking for help, and was given prescriptions that one of his friends said wouldn't help him.  She presented with complaint of opiate addiction, was given Ativan, and clonidine for withdrawal symptoms.  Patient was seen later in the day by [facility], and was started on Prozac.  He reports he's taken it for 3 days, and it is not helping either.  Patient admits to drinking tonight.  He reports he was feeling good this evening, but after drinking began to get more anxious and depressed.  He denies any thoughts of hurting himself or other people.  Patient reports he recently broke up with his girlfriend of 8 years, and that his mother died 4-5 years ago, and it is just now hitting him.  Patient reports he is "going crazy.",  but denies visual or auditory hallucinations, no delusions.  Patient is requesting help for depression, anxiety, polysubstance abuse.  Patient is currently handcuffed.  He reports he asked the police to do this so that they wouldn't think that he was going to do anything crazy.  He has not yet been violent.  Patient has been emotionally labile while in the emergency department laughing and joking 1 minute, and crying, and sobbing the next. Past Medical History  Diagnosis Date  . Hypertension   . Anxiety   . Anxiety    Past Surgical History  Procedure Laterality Date  . Tonsillectomy    . Wisdom tooth extraction     Family History  Problem Relation Age of Onset  . Hypertension Mother    History  Substance Use Topics   . Smoking status: Current Every Day Smoker -- 0.25 packs/day    Types: Cigarettes  . Smokeless tobacco: Never Used  . Alcohol Use: Yes    Review of Systems  See History of Present Illness; otherwise all other systems are reviewed and negative Allergies  Flexeril; Ibuprofen; Naproxen; Tramadol; and Vicodin  Home Medications   Current Outpatient Rx  Name  Route  Sig  Dispense  Refill  . cloNIDine (CATAPRES) 0.2 MG tablet   Oral   Take 0.5 tablets (0.1 mg total) by mouth 2 (two) times daily.   10 tablet   0   . ibuprofen (ADVIL,MOTRIN) 200 MG tablet   Oral   Take 400 mg by mouth every 6 (six) hours as needed for pain.         Marland Kitchen LORazepam (ATIVAN) 1 MG tablet   Oral   Take 1 tablet (1 mg total) by mouth 3 (three) times daily as needed for anxiety.   6 tablet   0   . methocarbamol (ROBAXIN) 500 MG tablet   Oral   Take 1 tablet (500 mg total) by mouth 2 (two) times daily as needed.   8 tablet   0    BP 134/79  Pulse 111  Temp(Src) 98.4 F (36.9 C) (Oral)  Resp 18  Ht 6\' 1"  (1.854 m)  Wt 268 lb (121.564 kg)  BMI 35.37 kg/m2  SpO2 96% Physical Exam  Nursing note and vitals reviewed. Constitutional: He is oriented to person,  place, and time. He appears well-developed and well-nourished.  HENT:  Head: Normocephalic and atraumatic.  Nose: Nose normal.  Mouth/Throat: Oropharynx is clear and moist.  Eyes: Conjunctivae and EOM are normal. Pupils are equal, round, and reactive to light.  Neck: Normal range of motion. Neck supple. No JVD present. No tracheal deviation present. No thyromegaly present.  Cardiovascular: Normal rate, regular rhythm, normal heart sounds and intact distal pulses.  Exam reveals no gallop and no friction rub.   No murmur heard. Pulmonary/Chest: Effort normal and breath sounds normal. No stridor. No respiratory distress. He has no wheezes. He has no rales. He exhibits no tenderness.  Abdominal: Soft. Bowel sounds are normal. He exhibits no  distension and no mass. There is no tenderness. There is no rebound and no guarding.  Musculoskeletal: Normal range of motion. He exhibits no edema and no tenderness.  Lymphadenopathy:    He has no cervical adenopathy.  Neurological: He is alert and oriented to person, place, and time. He exhibits normal muscle tone. Coordination normal.  Skin: Skin is warm and dry. No rash noted. No erythema. No pallor.  Psychiatric:  Labile mood, reports anxiety and depression.  No SI or HI and no hallucinations.  Poor insight and judgment.    ED Course  Procedures (including critical care time) Labs Review Labs Reviewed  COMPREHENSIVE METABOLIC PANEL - Abnormal; Notable for the following:    Potassium 3.4 (*)    AST 59 (*)    ALT 63 (*)    All other components within normal limits  ETHANOL - Abnormal; Notable for the following:    Alcohol, Ethyl (B) 235 (*)    All other components within normal limits  SALICYLATE LEVEL - Abnormal; Notable for the following:    Salicylate Lvl <2.0 (*)    All other components within normal limits  ACETAMINOPHEN LEVEL  CBC  URINE RAPID DRUG SCREEN (HOSP PERFORMED)   Imaging Review No results found.  MDM   1. Polysubstance abuse   2. Depression   3. Anxiety    27 year old male who reports anxiety, depression, that is worsening since starting Prozac, as well as polysubstance abuse.  Given his dual diagnosis, discussed with the PA, Leonette Most recommends placing him in the psych ED, on the CIWA, and clonidine protocol and they will see him    Olivia Mackie, MD 10/17/12 0216  2:53 AM Patient has been seen by Leonette Most, the psych, PA, who agrees that he is a good candidate for admission.  Patient now reports that he does not want to be admitted.  He only wants medications to control his depression and anxiety.  Patient filled his Ativan given to him earlier in the week.  There is concerned that he is drug seeking given his addictive personality.  It is not in his  best interest to continue benzodiazepines at this time.  Patient is encouraged to followup with [facility] for further treatment of his anxiety and depression.  She does not meet criteria for IVC.  He is not suicidal or homicidal.  He is not psychotic.  Patient both with myself, and nursing staff has been able to relate back to [place] the treatment offered and his refusal of said treatment.  Olivia Mackie, MD 10/17/12 [phone]

## 2012-10-17 NOTE — ED Notes (Signed)
Patient belongings are in locker 54

## 2012-10-17 NOTE — ED Notes (Signed)
Pt states that his mother died several years ago and he is just really starting to realize how upset he is. Pt admits to drinking five beers today. Pt states he is "going crazy" and needs help. Pt is tearful and obviously upset, but cooperative.

## 2012-10-17 NOTE — ED Notes (Signed)
[employer] officer at bedside speaking with patient.

## 2012-10-17 NOTE — ED Notes (Addendum)
Patient states that he does not want to stay in the hospital. "States I just need something for anxiety and depression. More so anxiety. The Prozac is not working. I cannot stay in the hospital 3-5 days because I have to be at work at 7 in the morning." Patient denies being homicidal or suicidal.

## 2012-10-17 NOTE — ED Notes (Signed)
Patient has 1 belonging bag. Bag contains:  -sneakers -blue jeans -socks -T-shirt -boxers  Patient also has a valuables envelope with security

## 2012-10-17 NOTE — Consult Note (Signed)
[facility] Face-to-Face Psychiatry Consult   Reason for Consult:  ED Referral Referring Physician:  Dr Lanora Manis [name] is an 27 y.o. male.  Assessment: AXIS I:  polysubstance dependence including opioids AXIS II:  Deferred AXIS III:   Past Medical History  Diagnosis Date  . Hypertension   . Anxiety   . Anxiety    AXIS IV:  economic problems cliams he couldn't afford meds  AXIS V:  31-40 impairment in reality testing  Plan:  Recommend psychiatric Inpatient admission when medically cleared.Pt resistance-claims he wants to detox at home but was given scripts for clonidine and ativan 2 days ago and did no ttake the clonidine and is back tonite asking for xanax.His blood etoh is 235 and he is tolerant yet denies he drinks.Will bring back to psych ED and put on clonidine and ativan protocols.Have [facility] eassess Saturday.He is not a candidate for self medication withdrawal.  Subjective:   Luis Wall is a 27 y.o. male patient admitted with c/o anxiety and drug seeking behaviors around xanax tonite. He has over 20 ED visits for c/o pain.He claims he last took Vicodin 4-5 hrs ago but there are no opiates in his urine so whatever he bought/took it was not opiate medication. His ETOH level is diagnostic of alcoholism (235 with normal walk and talk).He claims as noted in plan he wants home detox despite fact he failed 2 days ago after being in ED and given rx for same.This he claims was due to not being able to afford drugs but he he reports he didn't take the clonidine but apparently he took the ativan.His UDS is + for no tested substances tonite. 2 days ago he had no opiates in urine-only [facility]  HPI:  As above HPI Elements:   Context:  as above.  Past Psychiatric History: Past Medical History  Diagnosis Date  . Hypertension   . Anxiety   . Anxiety     reports that he has been smoking Cigarettes.  He has been smoking about 0.25 packs per day. He has never used smokeless tobacco. He reports that   drinks alcohol. He reports that he does not use illicit drugs. Family History  Problem Relation Age of Onset  . Hypertension Mother            Allergies:   Allergies  Allergen Reactions  . Flexeril [Cyclobenzaprine] Other (See Comments)    nightmares  . Ibuprofen Nausea Only  . Naproxen Nausea And Vomiting  . Tramadol Itching and Nausea Only  . Vicodin [Hydrocodone-Acetaminophen] Nausea Only    Pt reports "Oxycodone is the only pain med that doesn't make me sick"    ACT Assessment Complete:  No:   Past Psychiatric History: Diagnosis:  Polysubstance dependence including opioid  Hospitalizations:  None  Outpatient Care:  None  Substance Abuse Care:  See ED note 9/16  Self-Mutilation:  NO  Suicidal Attempts:  NO  Homicidal Behaviors:  NO   Violent Behaviors:  Not known   Place of Residence:  Home in [place] Marital Status:  Single Employed/Unemployed:  ? Education:  HS Family Supports:  Yes Objective: Blood pressure 100/54, pulse 86, temperature 98.4 F (36.9 C), temperature source Oral, resp. rate 18, height 6\' 1"  (1.854 m), weight 121.564 kg (268 lb), SpO2 96.00%.Body mass index is 35.37 kg/(m^2). Results for orders placed during the hospital encounter of 10/17/12 (from the past 72 hour(s))  ACETAMINOPHEN LEVEL     Status: None   Collection Time    10/17/12  1:13 AM      Result Value Range   Acetaminophen (Tylenol), Serum <15.0  10 - 30 ug/mL   Comment:            THERAPEUTIC CONCENTRATIONS VARY     SIGNIFICANTLY. A RANGE OF 10-30     ug/mL MAY BE AN EFFECTIVE     CONCENTRATION FOR MANY PATIENTS.     HOWEVER, SOME ARE BEST TREATED     AT CONCENTRATIONS OUTSIDE THIS     RANGE.     ACETAMINOPHEN CONCENTRATIONS     >150 ug/mL AT 4 HOURS AFTER     INGESTION AND >50 ug/mL AT 12     HOURS AFTER INGESTION ARE     OFTEN ASSOCIATED WITH TOXIC     REACTIONS.  CBC     Status: None   Collection Time    10/17/12  1:13 AM      Result Value Range   WBC 9.3  4.0 - 10.5 K/uL    RBC 5.65  4.22 - 5.81 MIL/uL   Hemoglobin 15.6  13.0 - 17.0 g/dL   HCT 45.4  09.8 - 11.9 %   MCV 80.0  78.0 - 100.0 fL   MCH 27.6  26.0 - 34.0 pg   MCHC 34.5  30.0 - 36.0 g/dL   RDW 14.7  82.9 - 56.2 %   Platelets 217  150 - 400 K/uL  COMPREHENSIVE METABOLIC PANEL     Status: Abnormal   Collection Time    10/17/12  1:13 AM      Result Value Range   Sodium 140  135 - 145 mEq/L   Potassium 3.4 (*) 3.5 - 5.1 mEq/L   Chloride 106  96 - 112 mEq/L   CO2 20  19 - 32 mEq/L   Glucose, Bld 98  70 - 99 mg/dL   BUN 9  6 - 23 mg/dL   Creatinine, Ser 1.30  0.50 - 1.35 mg/dL   Calcium 9.2  8.4 - 86.5 mg/dL   Total Protein 7.4  6.0 - 8.3 g/dL   Albumin 4.1  3.5 - 5.2 g/dL   AST 59 (*) 0 - 37 U/L   ALT 63 (*) 0 - 53 U/L   Alkaline Phosphatase 57  39 - 117 U/L   Total Bilirubin 0.3  0.3 - 1.2 mg/dL   GFR calc non Af Amer >90  >90 mL/min   GFR calc Af Amer >90  >90 mL/min   Comment: (NOTE)     The eGFR has been calculated using the CKD EPI equation.     This calculation has not been validated in all clinical situations.     eGFR's persistently <90 mL/min signify possible Chronic Kidney     Disease.  ETHANOL     Status: Abnormal   Collection Time    10/17/12  1:13 AM      Result Value Range   Alcohol, Ethyl (B) 235 (*) 0 - 11 mg/dL   Comment:            LOWEST DETECTABLE LIMIT FOR     SERUM ALCOHOL IS 11 mg/dL     FOR MEDICAL PURPOSES ONLY  SALICYLATE LEVEL     Status: Abnormal   Collection Time    10/17/12  1:13 AM      Result Value Range   Salicylate Lvl <2.0 (*) 2.8 - 20.0 mg/dL  URINE RAPID DRUG SCREEN (HOSP PERFORMED)     Status: None   Collection Time  10/17/12  1:17 AM      Result Value Range   Opiates NONE DETECTED  NONE DETECTED   Cocaine NONE DETECTED  NONE DETECTED   Benzodiazepines NONE DETECTED  NONE DETECTED   Amphetamines NONE DETECTED  NONE DETECTED   Tetrahydrocannabinol NONE DETECTED  NONE DETECTED   Barbiturates NONE DETECTED  NONE DETECTED   Comment:             DRUG SCREEN FOR MEDICAL PURPOSES     ONLY.  IF CONFIRMATION IS NEEDED     FOR ANY PURPOSE, NOTIFY LAB     WITHIN 5 DAYS.                LOWEST DETECTABLE LIMITS     FOR URINE DRUG SCREEN     Drug Class       Cutoff (ng/mL)     Amphetamine      1000     Barbiturate      200     Benzodiazepine   200     Tricyclics       300     Opiates          300     Cocaine          300     THC              50   Labs are reviewed and are pertinent for negative UDS ETOH 235.  Current Facility-Administered Medications  Medication Dose Route Frequency Provider Last Rate Last Dose  . acetaminophen (TYLENOL) tablet 650 mg  650 mg Oral Q4H PRN Olivia Mackie, MD      . alum & mag hydroxide-simeth (MAALOX/MYLANTA) 200-200-20 MG/5ML suspension 30 mL  30 mL Oral PRN Olivia Mackie, MD      . cloNIDine (CATAPRES) tablet 0.1 mg  0.1 mg Oral QID Olivia Mackie, MD       Followed by  . [START ON 10/19/2012] cloNIDine (CATAPRES) tablet 0.1 mg  0.1 mg Oral BH-qamhs Olivia Mackie, MD       Followed by  . [START ON 10/22/2012] cloNIDine (CATAPRES) tablet 0.1 mg  0.1 mg Oral QAC breakfast Olivia Mackie, MD      . dicyclomine (BENTYL) tablet 20 mg  20 mg Oral Q6H PRN Olivia Mackie, MD      . folic acid (FOLVITE) tablet 1 mg  1 mg Oral Daily Olivia Mackie, MD      . hydrOXYzine (ATARAX/VISTARIL) tablet 25 mg  25 mg Oral Q6H PRN Olivia Mackie, MD      . loperamide (IMODIUM) capsule 2-4 mg  2-4 mg Oral PRN Olivia Mackie, MD      . LORazepam (ATIVAN) tablet 1 mg  1 mg Oral Q6H PRN Olivia Mackie, MD       Or  . LORazepam (ATIVAN) injection 1 mg  1 mg Intravenous Q6H PRN Olivia Mackie, MD      . LORazepam (ATIVAN) tablet 0-4 mg  0-4 mg Oral Q6H Olivia Mackie, MD       Followed by  . [START ON 10/19/2012] LORazepam (ATIVAN) tablet 0-4 mg  0-4 mg Oral Q12H Olivia Mackie, MD      . methocarbamol (ROBAXIN) tablet 500 mg  500 mg Oral Q8H PRN Olivia Mackie, MD      . multivitamin with minerals tablet 1 tablet  1 tablet Oral Daily Olivia Mackie, MD      .  nicotine (NICODERM CQ - dosed in mg/24 hours) patch 21 mg  21 mg Transdermal Daily Olivia Mackie, MD      . ondansetron [facility]) tablet 4 mg  4 mg Oral Q8H PRN Olivia Mackie, MD      . ondansetron (ZOFRAN-ODT) disintegrating tablet 4 mg  4 mg Oral Q6H PRN Olivia Mackie, MD      . thiamine (VITAMIN B-1) tablet 100 mg  100 mg Oral Daily Olivia Mackie, MD       Or  . thiamine (B-1) injection 100 mg  100 mg Intravenous Daily Olivia Mackie, MD       Current Outpatient Prescriptions  Medication Sig Dispense Refill  . cloNIDine (CATAPRES) 0.2 MG tablet Take 0.5 tablets (0.1 mg total) by mouth 2 (two) times daily.  10 tablet  0  . FLUoxetine (PROZAC) 20 MG capsule Take 20 mg by mouth daily.      Marland Kitchen ibuprofen (ADVIL,MOTRIN) 200 MG tablet Take 400 mg by mouth every 6 (six) hours as needed for pain.      Marland Kitchen LORazepam (ATIVAN) 1 MG tablet Take 1 tablet (1 mg total) by mouth 3 (three) times daily as needed for anxiety.  6 tablet  0  . methocarbamol (ROBAXIN) 500 MG tablet Take 1 tablet (500 mg total) by mouth 2 (two) times daily as needed.  8 tablet  0    Psychiatric Specialty Exam:     Blood pressure 100/54, pulse 86, temperature 98.4 F (36.9 C), temperature source Oral, resp. rate 18, height 6\' 1"  (1.854 m), weight 121.564 kg (268 lb), SpO2 96.00%.Body mass index is 35.37 kg/(m^2).  General Appearance: Fairly Groomed  Patent attorney::  Fair  Speech:  Clear and Coherent  Volume:  Normal  Mood:  Euthymic c/o of being "really anxious"  Affect:  Non-Congruent  Thought Process:  Goal Directed-seeking xanax or similar medication  Orientation:  Full (Time, Place, and Person)  Thought Content:  Obsessions with drugs taht alter mood/addictive substances  Suicidal Thoughts:  No  Homicidal Thoughts:  No  Memory:  Recent;   Good  Judgement:  Poor  Insight:  Lacking  Psychomotor Activity:  Normal  Concentration:  Fair  Recall:  Good  Akathisia:  NA  Handed:  Right  AIMS (if indicated):      Assets:  Others:  lacking  Sleep:   No complaints   Treatment Plan Summary: Daily contact with patient to assess and evaluate symptoms and progress in treatment.Recommend inpt therapy per plan above  Addendum:Pt has informed his ED nurse he wants to leave-he does not want treatment other than prescriptions for drugs he is seeking.No reason to IVC this patient psychiatrically.He should return when he wants treatment to stop using.  Maryjean Morn E 10/17/2012 2:21 AM

## 2012-10-17 NOTE — ED Notes (Signed)
Patient woken up to be discharged. Patient reluctant to wake and dozes back off.

## 2012-10-17 NOTE — ED Notes (Signed)
Behavorial Health PA, Leonette Most, at bedside evaluating patient. Patient given [place] sandwich.

## 2012-10-17 NOTE — ED Notes (Signed)
Patient states he is hungry. Patient also c/o nausea. Informed patient that nurse would medicate for nausea before giving him food. Patient declined nausea medicine. States "I just need food to sober me up."

## 2012-10-17 NOTE — ED Notes (Signed)
Explained to patient that in order to be started on depression and/or anxiety meds he would need to stay in the hospital. Patient states "I just want to go home." Patient denies having suicidal or homicidal thoughts.

## 2013-10-10 ENCOUNTER — Emergency Department (HOSPITAL_COMMUNITY)
Admission: EM | Admit: 2013-10-10 | Discharge: 2013-10-10 | Disposition: A | Discharge status: Home / Self Care | Payer: Self-pay | Attending: Emergency Medicine | Admitting: Emergency Medicine

## 2013-10-10 ENCOUNTER — Encounter (HOSPITAL_COMMUNITY): Payer: Self-pay | Admitting: Emergency Medicine

## 2013-10-10 DIAGNOSIS — F17200 Nicotine dependence, unspecified, uncomplicated: Secondary | ICD-10-CM | POA: Insufficient documentation

## 2013-10-10 DIAGNOSIS — F3289 Other specified depressive episodes: Secondary | ICD-10-CM | POA: Insufficient documentation

## 2013-10-10 DIAGNOSIS — F329 Major depressive disorder, single episode, unspecified: Secondary | ICD-10-CM

## 2013-10-10 DIAGNOSIS — I10 Essential (primary) hypertension: Secondary | ICD-10-CM | POA: Insufficient documentation

## 2013-10-10 DIAGNOSIS — F411 Generalized anxiety disorder: Secondary | ICD-10-CM | POA: Insufficient documentation

## 2013-10-10 DIAGNOSIS — F419 Anxiety disorder, unspecified: Secondary | ICD-10-CM

## 2013-10-10 DIAGNOSIS — F32A Depression, unspecified: Secondary | ICD-10-CM

## 2013-10-10 MED ORDER — LORAZEPAM 1 MG PO TABS
1.0000 mg | ORAL_TABLET | Freq: Once | ORAL | Status: AC
  Administered 2013-10-10: 1 mg via ORAL
  Filled 2013-10-10: qty 1

## 2013-10-10 MED ORDER — LORAZEPAM 1 MG PO TABS: 1.0000 mg | ORAL_TABLET | Freq: Three times a day (TID) | ORAL | Status: DC | PRN

## 2013-10-10 NOTE — ED Provider Notes (Signed)
Medical screening examination/treatment/procedure(s) were performed by non-physician practitioner and as supervising physician I was immediately available for consultation/collaboration.   EKG Interpretation None        Ziaire Bieser F Majid Mccravy, MD 10/10/13 0711 
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
## 2013-10-10 NOTE — Discharge Instructions (Signed)
Take the prescribed medication as directed. Follow-up with a psychiatrist/counselor from the list provided. Return to the ED for new or worsening symptoms.
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
## 2013-10-10 NOTE — ED Notes (Signed)
Patient stated he is having anxiety but do not want to hurt himself

## 2013-11-11 ENCOUNTER — Emergency Department (HOSPITAL_COMMUNITY)
Admission: EM | Admit: 2013-11-11 | Discharge: 2013-11-11 | Disposition: A | Discharge status: Home / Self Care | Payer: Self-pay | Attending: Emergency Medicine | Admitting: Emergency Medicine

## 2013-11-11 ENCOUNTER — Encounter (HOSPITAL_COMMUNITY): Payer: Self-pay | Admitting: Emergency Medicine

## 2013-11-11 DIAGNOSIS — Y9389 Activity, other specified: Secondary | ICD-10-CM | POA: Insufficient documentation

## 2013-11-11 DIAGNOSIS — X58XXXA Exposure to other specified factors, initial encounter: Secondary | ICD-10-CM | POA: Insufficient documentation

## 2013-11-11 DIAGNOSIS — Y9289 Other specified places as the place of occurrence of the external cause: Secondary | ICD-10-CM | POA: Insufficient documentation

## 2013-11-11 DIAGNOSIS — Z8659 Personal history of other mental and behavioral disorders: Secondary | ICD-10-CM | POA: Insufficient documentation

## 2013-11-11 DIAGNOSIS — S39012A Strain of muscle, fascia and tendon of lower back, initial encounter: Secondary | ICD-10-CM | POA: Insufficient documentation

## 2013-11-11 DIAGNOSIS — Z720 Tobacco use: Secondary | ICD-10-CM | POA: Insufficient documentation

## 2013-11-11 DIAGNOSIS — I10 Essential (primary) hypertension: Secondary | ICD-10-CM | POA: Insufficient documentation

## 2013-11-11 MED ORDER — KETOROLAC TROMETHAMINE 60 MG/2ML IM SOLN
60.0000 mg | Freq: Once | INTRAMUSCULAR | Status: AC
  Administered 2013-11-11: 60 mg via INTRAMUSCULAR
  Filled 2013-11-11: qty 2

## 2013-11-11 MED ORDER — DICLOFENAC SODIUM 75 MG PO TBEC: 75.0000 mg | DELAYED_RELEASE_TABLET | Freq: Two times a day (BID) | ORAL | Status: DC

## 2013-11-11 MED ORDER — METHOCARBAMOL 500 MG PO TABS: 500.0000 mg | ORAL_TABLET | Freq: Two times a day (BID) | ORAL | Status: DC

## 2013-11-11 NOTE — Discharge Instructions (Signed)
Take Diclofenac as needed for pain. Take Robaxin as needed for muscle spasm. You may take these medications together. Refer to attached documents for more information.

## 2013-11-11 NOTE — ED Provider Notes (Signed)
Medical screening examination/treatment/procedure(s) were performed by non-physician practitioner and as supervising physician I was immediately available for consultation/collaboration.   EKG Interpretation None        Matthew Gentry, MD 11/11/13 2244 

## 2013-11-11 NOTE — ED Notes (Signed)
Patient reports friend will transport home from waiting room.

## 2013-11-11 NOTE — ED Notes (Signed)
Pt states that he was carrying shingles 3 days ago and felt a pop to his back; pt states that he dropped the shingles and fell to his knees from the pain; pt states that he has been laying in bed for the last couple of days with no improvement of the pain; pt states that the pain is to his lower back; pt denies numbness or tingling or weakness; pt states that the pain is increasing his anxiety and makes him feel like it is difficult to catch his breath at times.

## 2013-11-11 NOTE — ED Provider Notes (Signed)
CSN: [id number]     Arrival date & time 11/11/13  [id number] History   First MD Initiated Contact with Patient 11/11/13 0340     Chief Complaint  Patient presents with  . Back Pain     (Consider location/radiation/quality/duration/timing/severity/associated sxs/prior Treatment) HPI Comments: Patient is a 28 year old male with a past medical history of polysubstance abuse who presents with sudden onset of lower back pain that started 3 days ago while carrying shingles. Patient reports feeling a "pop" in his back and sudden onset of pain. The pain is sharp and severe and does not radiate. The pain is constant. Movement makes the pain worse. Nothing makes the pain better. Patient has tried ibuprofen for pain without relief. No associated symptoms. No saddle paresthesias or bladder/bowel incontinence.      Past Medical History  Diagnosis Date  . Hypertension   . Anxiety   . Anxiety    Past Surgical History  Procedure Laterality Date  . Tonsillectomy    . Wisdom tooth extraction     Family History  Problem Relation Age of Onset  . Hypertension Mother    History  Substance Use Topics  . Smoking status: Current Every Day Smoker -- 0.50 packs/day    Types: Cigarettes  . Smokeless tobacco: Never Used  . Alcohol Use: Yes     Comment: socially    Review of Systems  Constitutional: Negative for fever, chills and fatigue.  HENT: Negative for trouble swallowing.   Eyes: Negative for visual disturbance.  Respiratory: Negative for shortness of breath.   Cardiovascular: Negative for chest pain and palpitations.  Gastrointestinal: Negative for nausea, vomiting, abdominal pain and diarrhea.  Genitourinary: Negative for dysuria and difficulty urinating.  Musculoskeletal: Positive for back pain. Negative for arthralgias and neck pain.  Skin: Negative for color change.  Neurological: Negative for dizziness and weakness.  Psychiatric/Behavioral: Negative for dysphoric mood.      Allergies   Flexeril; Ibuprofen; Naproxen; Tramadol; and Vicodin  Home Medications   Prior to Admission medications   Medication Sig Start Date End Date Taking? Authorizing Provider  ibuprofen (ADVIL,MOTRIN) 200 MG tablet Take 800 mg by mouth every 6 (six) hours as needed for moderate pain.   Yes Historical Provider, MD   BP 127/77  Pulse 102  Temp(Src) 98.4 F (36.9 C) (Oral)  Resp 20  Ht 6\' 1"  (1.854 m)  Wt 235 lb (106.595 kg)  BMI 31.01 kg/m2  SpO2 100% Physical Exam  Nursing note and vitals reviewed. Constitutional: He is oriented to person, place, and time. He appears well-developed and well-nourished. No distress.  HENT:  Head: Normocephalic and atraumatic.  Eyes: Conjunctivae and EOM are normal.  Neck: Normal range of motion.  Cardiovascular: Normal rate and regular rhythm.  Exam reveals no gallop and no friction rub.   No murmur heard. Pulmonary/Chest: Effort normal and breath sounds normal. He has no wheezes. He has no rales. He exhibits no tenderness.  Abdominal: Soft. He exhibits no distension. There is no tenderness. There is no rebound and no guarding.  Musculoskeletal: Normal range of motion.  No midline spine tenderness to palpation. Bilateral paraspinal lumbar tenderness to palpation.   Neurological: He is alert and oriented to person, place, and time. Coordination normal.  Lower extremity strength and sensation equal and intact bilaterally. Speech is goal-oriented. Moves limbs without ataxia.   Skin: Skin is warm and dry.  Psychiatric: He has a normal mood and affect. His behavior is normal.    ED Course  Procedures (including critical care time) Labs Review Labs Reviewed - No data to display  Imaging Review No results found.   EKG Interpretation None      MDM   Final diagnoses:  Lumbar strain, initial encounter    4:15 AM Patient likely has a muscle strain. Patient will have IM toradol for pain here. Pain is reproducible with palpation of lumbar  paraspinal muscles. No bladder/bowel incontinence or saddle paresthesias. Patient will be discharged with Robaxin and Diclofenac. Patient has a history polysubstance abuse including opioids so patient will not receive narcotic prescriptions. No other injuries.     Emilia BeckKaitlyn Maeghan Canny, PA-C 11/11/13 0425

## 2013-11-11 NOTE — ED Notes (Signed)
Patient c/o lower back pain 10/10, denies numbness/tingling.

## 2013-11-23 ENCOUNTER — Emergency Department (HOSPITAL_COMMUNITY): Payer: Self-pay

## 2013-11-23 ENCOUNTER — Encounter (HOSPITAL_COMMUNITY): Payer: Self-pay | Admitting: Emergency Medicine

## 2013-11-23 ENCOUNTER — Emergency Department (HOSPITAL_COMMUNITY)
Admission: EM | Admit: 2013-11-23 | Discharge: 2013-11-23 | Disposition: A | Discharge status: Home / Self Care | Payer: Self-pay | Attending: Emergency Medicine | Admitting: Emergency Medicine

## 2013-11-23 DIAGNOSIS — W2209XA Striking against other stationary object, initial encounter: Secondary | ICD-10-CM | POA: Insufficient documentation

## 2013-11-23 DIAGNOSIS — Z791 Long term (current) use of non-steroidal anti-inflammatories (NSAID): Secondary | ICD-10-CM | POA: Insufficient documentation

## 2013-11-23 DIAGNOSIS — Z720 Tobacco use: Secondary | ICD-10-CM | POA: Insufficient documentation

## 2013-11-23 DIAGNOSIS — Y9389 Activity, other specified: Secondary | ICD-10-CM | POA: Insufficient documentation

## 2013-11-23 DIAGNOSIS — I10 Essential (primary) hypertension: Secondary | ICD-10-CM | POA: Insufficient documentation

## 2013-11-23 DIAGNOSIS — Z79899 Other long term (current) drug therapy: Secondary | ICD-10-CM | POA: Insufficient documentation

## 2013-11-23 DIAGNOSIS — S6992XA Unspecified injury of left wrist, hand and finger(s), initial encounter: Secondary | ICD-10-CM | POA: Insufficient documentation

## 2013-11-23 DIAGNOSIS — Y9289 Other specified places as the place of occurrence of the external cause: Secondary | ICD-10-CM | POA: Insufficient documentation

## 2013-11-23 DIAGNOSIS — S6990XA Unspecified injury of unspecified wrist, hand and finger(s), initial encounter: Secondary | ICD-10-CM

## 2013-11-23 DIAGNOSIS — Z8659 Personal history of other mental and behavioral disorders: Secondary | ICD-10-CM | POA: Insufficient documentation

## 2013-11-23 MED ORDER — OXYCODONE-ACETAMINOPHEN 5-325 MG PO TABS
2.0000 | ORAL_TABLET | Freq: Once | ORAL | Status: AC
  Administered 2013-11-23: 2 via ORAL
  Filled 2013-11-23: qty 2

## 2013-11-23 NOTE — ED Notes (Signed)
Patient ambulated to X-ray 
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
## 2013-11-23 NOTE — ED Notes (Signed)
Ortho tech notified of pending orders 

## 2013-11-23 NOTE — ED Notes (Signed)
Bed: [id number] Expected date: 11/23/13 Expected time: 5:58 AM Means of arrival: Ambulance Comments: 28 yo M  Hand injury
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
## 2014-01-17 ENCOUNTER — Encounter (HOSPITAL_COMMUNITY): Payer: Self-pay | Admitting: Emergency Medicine

## 2014-01-17 ENCOUNTER — Emergency Department (HOSPITAL_COMMUNITY)
Admission: EM | Admit: 2014-01-17 | Discharge: 2014-01-17 | Disposition: A | Discharge status: Home / Self Care | Payer: Self-pay | Attending: Emergency Medicine | Admitting: Emergency Medicine

## 2014-01-17 ENCOUNTER — Emergency Department (HOSPITAL_COMMUNITY): Payer: Self-pay

## 2014-01-17 DIAGNOSIS — W1839XA Other fall on same level, initial encounter: Secondary | ICD-10-CM | POA: Insufficient documentation

## 2014-01-17 DIAGNOSIS — Z720 Tobacco use: Secondary | ICD-10-CM | POA: Insufficient documentation

## 2014-01-17 DIAGNOSIS — S79911A Unspecified injury of right hip, initial encounter: Secondary | ICD-10-CM | POA: Insufficient documentation

## 2014-01-17 DIAGNOSIS — Y9289 Other specified places as the place of occurrence of the external cause: Secondary | ICD-10-CM | POA: Insufficient documentation

## 2014-01-17 DIAGNOSIS — W19XXXA Unspecified fall, initial encounter: Secondary | ICD-10-CM

## 2014-01-17 DIAGNOSIS — M25551 Pain in right hip: Secondary | ICD-10-CM

## 2014-01-17 DIAGNOSIS — Z8659 Personal history of other mental and behavioral disorders: Secondary | ICD-10-CM | POA: Insufficient documentation

## 2014-01-17 DIAGNOSIS — Y998 Other external cause status: Secondary | ICD-10-CM | POA: Insufficient documentation

## 2014-01-17 DIAGNOSIS — S3992XA Unspecified injury of lower back, initial encounter: Secondary | ICD-10-CM | POA: Insufficient documentation

## 2014-01-17 DIAGNOSIS — I10 Essential (primary) hypertension: Secondary | ICD-10-CM | POA: Insufficient documentation

## 2014-01-17 DIAGNOSIS — Y9389 Activity, other specified: Secondary | ICD-10-CM | POA: Insufficient documentation

## 2014-01-17 DIAGNOSIS — S8991XA Unspecified injury of right lower leg, initial encounter: Secondary | ICD-10-CM | POA: Insufficient documentation

## 2014-01-17 LAB — URINALYSIS, ROUTINE W REFLEX MICROSCOPIC
Bilirubin Urine: NEGATIVE
Glucose, UA: NEGATIVE mg/dL
HGB URINE DIPSTICK: NEGATIVE
Ketones, ur: NEGATIVE mg/dL
Leukocytes, UA: NEGATIVE
Nitrite: NEGATIVE
PROTEIN: NEGATIVE mg/dL
Specific Gravity, Urine: 1.022 (ref 1.005–1.030)
Urobilinogen, UA: 0.2 mg/dL (ref 0.0–1.0)
pH: 6 (ref 5.0–8.0)

## 2014-01-17 MED ORDER — METHOCARBAMOL 500 MG PO TABS: 500.0000 mg | ORAL_TABLET | Freq: Two times a day (BID) | ORAL | Status: DC

## 2014-01-17 MED ORDER — IBUPROFEN 800 MG PO TABS
800.0000 mg | ORAL_TABLET | Freq: Once | ORAL | Status: AC
  Administered 2014-01-17: 800 mg via ORAL
  Filled 2014-01-17: qty 1

## 2014-01-17 MED ORDER — OXYCODONE-ACETAMINOPHEN 5-325 MG PO TABS
2.0000 | ORAL_TABLET | Freq: Once | ORAL | Status: AC
  Administered 2014-01-17: 2 via ORAL
  Filled 2014-01-17: qty 2

## 2014-01-17 NOTE — ED Notes (Signed)
Pt able to ambulate with no difficulties but reports pain upon applying pressure to the right leg.

## 2014-01-17 NOTE — Discharge Instructions (Signed)

## 2014-01-17 NOTE — ED Provider Notes (Signed)
CSN: [id number]     Arrival date & time 01/17/14  [id number] History   First MD Initiated Contact with Patient 01/17/14 [phone]     Chief Complaint  Patient presents with  . Fall  . Hip Pain  . Leg Pain     (Consider location/radiation/quality/duration/timing/severity/associated sxs/prior Treatment) HPI Comments: Patient c/o R hip pain radiating down R leg after falling off porch yesterday morning.  Did not hit head or LOC.  Pain in low back, radiating down R lateral hip and R leg.  Hx previous injury to hip in 2003. Reduced strength due to pain.  No numbness or tingling.  No incontinence.  No fever, vomiting, IVDA, history of cancer. Using ibuprofen without relief. Denies any chest pain, abdominal pain, midline back pain, testicular pain.  Patient is a 28 y.o. male presenting with leg pain. The history is provided by the patient.  Leg Pain Associated symptoms: no back pain     Past Medical History  Diagnosis Date  . Hypertension   . Anxiety   . Anxiety    Past Surgical History  Procedure Laterality Date  . Tonsillectomy    . Wisdom tooth extraction     Family History  Problem Relation Age of Onset  . Hypertension Mother    History  Substance Use Topics  . Smoking status: Current Every Day Smoker -- 0.50 packs/day    Types: Cigarettes  . Smokeless tobacco: Never Used  . Alcohol Use: Yes     Comment: socially    Review of Systems  Constitutional: Negative for activity change and appetite change.  Respiratory: Negative for cough, chest tightness and shortness of breath.   Cardiovascular: Negative for chest pain.  Gastrointestinal: Negative for nausea, vomiting and abdominal pain.  Genitourinary: Negative for dysuria and hematuria.  Musculoskeletal: Positive for myalgias and arthralgias. Negative for back pain.  Skin: Negative for rash.  Neurological: Negative for dizziness, weakness and headaches.  A complete 10 system review of systems was obtained and all systems are  negative except as noted in the HPI and PMH.      Allergies  Flexeril; Ibuprofen; Naproxen; Tramadol; and Vicodin  Home Medications   Prior to Admission medications   Medication Sig Start Date End Date Taking? Authorizing Provider  ibuprofen (ADVIL,MOTRIN) 200 MG tablet Take 600 mg by mouth every 6 (six) hours as needed (For pain.).    Yes Historical Provider, MD  diclofenac (VOLTAREN) 75 MG EC tablet Take 1 tablet (75 mg total) by mouth 2 (two) times daily. Patient not taking: Reported on 01/17/2014 11/11/13   Emilia BeckKaitlyn Szekalski, PA-C  methocarbamol (ROBAXIN) 500 MG tablet Take 1 tablet (500 mg total) by mouth 2 (two) times daily. 01/17/14   Glynn OctaveStephen Octavian Godek, MD   BP 127/76 mmHg  Pulse 65  Temp(Src) 98.2 F (36.8 C) (Oral)  Resp 16  SpO2 100% Physical Exam  Constitutional: He is oriented to person, place, and time. He appears well-developed and well-nourished. No distress.  HENT:  Head: Normocephalic and atraumatic.  Mouth/Throat: Oropharynx is clear and moist. No oropharyngeal exudate.  Eyes: Conjunctivae and EOM are normal. Pupils are equal, round, and reactive to light.  Neck: Normal range of motion. Neck supple.  No meningismus  Cardiovascular: Normal rate, regular rhythm, normal heart sounds and intact distal pulses.   No murmur heard. +2 femoral, DP, PT pulses  Pulmonary/Chest: Effort normal and breath sounds normal. No respiratory distress.  Abdominal: Soft. There is no tenderness. There is no rebound and no guarding.  Musculoskeletal: Normal range of motion. He exhibits no edema or tenderness.  Tenderness to right lateral hip without deformity. Paraspinal lumbar pain, no midline pain  Neurological: He is alert and oriented to person, place, and time. He has normal reflexes. No cranial nerve deficit. He exhibits normal muscle tone. Coordination normal.  4/5 strength in R lower extremity with poor effort due to pain. 5/5 strength on L lower extremity. Ankle plantar and  dorsiflexion intact. Great toe extension intact bilaterally. +2 DP and PT pulses. +2 patellar reflexes bilaterally. Normal gait.   Skin: Skin is warm and dry. No rash noted.  Psychiatric: He has a normal mood and affect. His behavior is normal.  Nursing note and vitals reviewed.   ED Course  Procedures (including critical care time) Labs Review Labs Reviewed  URINALYSIS, ROUTINE W REFLEX MICROSCOPIC    Imaging Review Dg Lumbar Spine Complete  01/17/2014   CLINICAL DATA:  Fall at right hip yesterday. Pain radiating down right leg.  EXAM: LUMBAR SPINE - COMPLETE 4+ VIEW  COMPARISON:  Lumbar spine radiographs 06/28/2012  FINDINGS: Normal alignment of lumbar vertebral bodies. No loss of vertebral body height or disc height. No pars fracture. No subluxation.  IMPRESSION: Normal lumbar radiographs.   Electronically Signed   By: Genevive BiStewart  Edmunds M.D.   On: 01/17/2014 07:43   Dg Hip Complete Right  01/17/2014   CLINICAL DATA:  Followup the right hip. Right hip pain radiates down right leg. Fall 1 day prior  EXAM: RIGHT HIP - COMPLETE 2+ VIEW  COMPARISON:  None.  FINDINGS: Hips are located. No pelvic fracture sacral fracture. Dedicated view of the right hip demonstrates no femoral neck fracture.  IMPRESSION: No pelvic fracture or hip fracture.   Electronically Signed   By: Genevive BiStewart  Edmunds M.D.   On: 01/17/2014 07:41     EKG Interpretation None      MDM   Final diagnoses:  Fall  Hip pain, acute, right   Fall with right hip pain. Did not hit head or LOC. Xrays negative for fracture or dislocation. Distal pulses intact.   Strength equal on reassessment after pain control.  Narcotic database reviewed.  Patient with one oxycodone prescription in October 2015.  Patient able to ambulate without difficulty.  Will not be prescribed narcotics due to opiate abuse in the past. Treat with antiinflammatories and muscle relaxer. Return precautions discussed.  Glynn OctaveStephen Tal Kempker, MD 01/17/14  [phone]

## 2014-01-17 NOTE — ED Notes (Signed)
Pt at X-ray

## 2014-01-17 NOTE — ED Notes (Signed)
Pt states that he slipped and fell yesterday morning, landing on his rt hip.  C/o pain to that hip that radiates down leg.  Pt ambulatory with limp.

## 2014-03-23 ENCOUNTER — Emergency Department (HOSPITAL_COMMUNITY)
Admission: EM | Admit: 2014-03-23 | Discharge: 2014-03-23 | Disposition: A | Discharge status: Home / Self Care | Payer: Self-pay | Attending: Emergency Medicine | Admitting: Emergency Medicine

## 2014-03-23 ENCOUNTER — Encounter (HOSPITAL_COMMUNITY): Payer: Self-pay | Admitting: *Deleted

## 2014-03-23 ENCOUNTER — Emergency Department (HOSPITAL_COMMUNITY): Payer: Self-pay

## 2014-03-23 DIAGNOSIS — Z8659 Personal history of other mental and behavioral disorders: Secondary | ICD-10-CM | POA: Insufficient documentation

## 2014-03-23 DIAGNOSIS — Y9389 Activity, other specified: Secondary | ICD-10-CM | POA: Insufficient documentation

## 2014-03-23 DIAGNOSIS — Y998 Other external cause status: Secondary | ICD-10-CM | POA: Insufficient documentation

## 2014-03-23 DIAGNOSIS — M79641 Pain in right hand: Secondary | ICD-10-CM

## 2014-03-23 DIAGNOSIS — Y9289 Other specified places as the place of occurrence of the external cause: Secondary | ICD-10-CM | POA: Insufficient documentation

## 2014-03-23 DIAGNOSIS — S6991XA Unspecified injury of right wrist, hand and finger(s), initial encounter: Secondary | ICD-10-CM | POA: Insufficient documentation

## 2014-03-23 DIAGNOSIS — W2209XA Striking against other stationary object, initial encounter: Secondary | ICD-10-CM | POA: Insufficient documentation

## 2014-03-23 DIAGNOSIS — Z720 Tobacco use: Secondary | ICD-10-CM | POA: Insufficient documentation

## 2014-03-23 DIAGNOSIS — Z79899 Other long term (current) drug therapy: Secondary | ICD-10-CM | POA: Insufficient documentation

## 2014-03-23 DIAGNOSIS — I10 Essential (primary) hypertension: Secondary | ICD-10-CM | POA: Insufficient documentation

## 2014-03-23 MED ORDER — ACETAMINOPHEN 325 MG PO TABS
650.0000 mg | ORAL_TABLET | Freq: Four times a day (QID) | ORAL | Status: DC | PRN
  Administered 2014-03-23: 650 mg via ORAL
  Filled 2014-03-23: qty 2
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
## 2014-03-23 NOTE — Discharge Instructions (Signed)
Read the information below.  You may return to the Emergency Department at any time for worsening condition or any new symptoms that concern you.  If you develop uncontrolled pain, weakness or numbness of the extremity, severe discoloration of the skin, or you are unable to move your hand, return to the ER for a recheck.      Emergency Department Resource Guide 1) Find a Doctor and Pay Out of Pocket Although you won't have to find out who is covered by your insurance plan, it is a good idea to ask around and get recommendations. You will then need to call the office and see if the doctor you have chosen will accept you as a new patient and what types of options they offer for patients who are self-pay. Some doctors offer discounts or will set up payment plans for their patients who do not have insurance, but you will need to ask so you aren't surprised when you get to your appointment.  2) Contact Your Local Health Department Not all health departments have doctors that can see patients for sick visits, but many do, so it is worth a call to see if yours does. If you don't know where your local health department is, you can check in your phone book. The CDC also has a tool to help you locate your state's health department, and many state websites also have listings of all of their local health departments.  3) Find a Walk-in Clinic If your illness is not likely to be very severe or complicated, you may want to try a walk in clinic. These are popping up all over the country in pharmacies, drugstores, and shopping centers. They're usually staffed by nurse practitioners or physician assistants that have been trained to treat common illnesses and complaints. They're usually fairly quick and inexpensive. However, if you have serious medical issues or chronic medical problems, these are probably not your best option.  No Primary Care Doctor: - Call Health Connect at  [phone] - they can help you locate a  primary care doctor that  accepts your insurance, provides certain services, etc. - Physician Referral Service- [phone]  Chronic Pain Problems: Organization         Address  Phone   Notes  [facility]  [phone] Patients need to be referred by their primary care doctor.   Medication Assistance: Organization         Address  Phone   Notes  [facility] Medication [facility] [address] [place] [place], [place] [address] [phone] --Must be a resident of [facility] -- Must have NO insurance coverage whatsoever (no Medicaid/ Medicare, etc.) -- The pt. MUST have a primary care doctor that directs their care regularly and follows them in the community   [facility]  [phone]   [employer]  [phone]    Agencies that provide inexpensive medical care: Organization         Address  Phone   Notes  [facility]  [phone]   [facility]    [phone]   [facility] [address] [place], [place] [address] [phone]   [facility] [address] [place], [place] [phone]   Planned Parenthood    [phone]   [facility]    [phone]   [facility] [place] [facility]  [address], [place] Phone:  [phone])  [phone], Fax:  [phone] Hours of Operation:  9 am - 6 pm, M-F.  Also accepts Medicaid/Medicare and self-pay.  °[facility] ° [address], [place] Phone: [phone], Fax: [phone]. Hours of Operation:  8:30 am - 5:30 pm, M-F.  Also accepts Medicaid and self-pay.  °[facility] [place] [address], [place] Phone: [phone]   °[facility] [address], [place], [place] [phone], Ext. 123 Mondays & Thursdays: 7-9 AM.  First 15 patients are seen on a first come, first serve basis. °  ° °Medicaid-accepting [place]  Providers: ° °Organization         Address  Phone   Notes  °[facility] [address], [place] [phone] Also accepts self-pay patients.  °[facility] [address], [place] ° [phone]) [phone]   °[facility] [address], [place] [phone]) [phone]   °[facility] [address] [place] [address], [place] [phone]) [phone]   °Veita Bland [address], [place]  ° [phone]) [phone] Only accepts [place] Access Medicaid patients after they have their name applied to their card.  ° °Self-Pay (no insurance) in [place]: ° °Organization         Address  Phone   Notes  °Sickle Cell Patients, [facility] [address], [place] [phone]   °[facility] [address], [place] [phone]) [phone]   °[facility] [place] ° [address],  [phone]) [phone]   °[facility]/Dr. Osei-Bonsu ° [address] [place] [address], [place] or [address], [place] [phone] Phone number for both [place] and [place] locations is the same.  °[facility] [address], [place] [phone]) [phone]   °[facility] [address] [place] [address], [place] or [address] [phone]) [phone] °([phone]) [phone]   °[facility] [address], [place] ([phone]) [phone], phone; [phone], fax Sees patients 1st and 3rd Saturday of every month.  Must not qualify for public or private insurance (i.e. Medicaid, Medicare, [place] Health Choice, Veterans' Benefits) • Household income should be no more than 200% of the poverty level •The clinic cannot treat you if you are pregnant or think you are pregnant • Sexually transmitted diseases are not treated at the clinic.  ° ° °Dental Care: °Organization         Address  Phone  Notes  °[place] [facility] [address], [place] [phone]) [phone] Accepts children up to age 21 who are enrolled in Medicaid or [place] Health Choice; pregnant women with a Medicaid card; and children who have applied for Medicaid or [place] Health Choice, but were declined, whose parents can pay a reduced fee at time of service.  °[place] [facility] [place]  [address], [place] [phone]) [phone] Accepts children up to age 21 who are enrolled in Medicaid or  Health Choice; pregnant women with a Medicaid card; and children who have applied for Medicaid or  Health Choice, but were declined, whose parents can pay a reduced fee at time of service.  °[facility] ° [address], [place] [phone]) [phone] Patients are seen by appointment only. Walk-ins are   not accepted. [facility] will see patients 27 years of age and older. Monday - Tuesday (8am-5pm) Most Wednesdays (8:30-5pm) $30 per visit, cash only  [facility]  [address], [facility] [phone] Patients are seen by appointment only. Walk-ins are not accepted. [facility] will see patients 27 years of age and older. One Wednesday Evening (Monthly: Volunteer Based).  $30 per visit, cash only  [employer] [facility] [facility]  [phone] for adults; Children under age 674, call Graduate Pediatric Dentistry at [phone]. Children aged 514-14, please call [phone] to request a pediatric application.  Dental services are provided in all areas of dental care including fillings, crowns and bridges, complete and partial dentures, implants, gum treatment, root canals, and extractions. Preventive care is also provided. Treatment is provided to both adults and children. Patients are selected via a lottery and there is often a waiting list.   [facility] [address], [place]  [phone] [email or web address]   [facility]  [address], [place], [place] [phone], Ext. [phone] Second and Fourth Thursday of each month, opens at 6:30 AM; Clinic ends at 9 AM.  Patients are seen on a first-come first-served basis, and a limited number are seen during each clinic.   [facility]  [address], [place], [place] [phone]   Eligibility Requirements You must have lived in [place], [place], or [place] counties for at least the last three months.   You cannot be eligible for state or federal sponsored [place]care insurance, including CIGNAVeterans Administration, [place]icaid, or [employer]icare.   You generally cannot be eligible for healthcare insurance through your employer.    How to apply: Eligibility screenings are held every Tuesday and Wednesday afternoon from 1:00 pm until 4:00 pm. You do not need an appointment for the interview!  [facility] [address], [place], [place] [phone]   [facility]  [phone]   [facility]  [phone]   [facility]  [phone]    Behavioral Health Resources in the Community: Intensive Outpatient Programs Organization         Address  Phone  Notes  [facility] [address], [place], [place] [phone]   [facility] [address], [place], [place] [phone]   [facility] [address], [place], [place]  [phone]   [facility] [address],  [place], [place] [phone] or [phone]   Substance Abuse Resources Organization         Address  Phone  Notes  [facility]  [phone]   [facility]  [phone]   [facility]  [phone]   [facility]  [phone]   Residential & Outpatient Substance Abuse Program  [phone]   Psychological Services Organization         Address  Phone  Notes  [facility]  [phone]- [phone]    [facility]  [phone]   [facility] [address], [place] [phone] or [phone]    Mobile Crisis Teams Organization         Address  Phone  Notes  [facility], Mobile Crisis Care Unit  [phone]   [facility]  [address] [place], [place] [phone]   [facility] [address] [place] [phone]    Self-Help/Support  Groups Organization         Address  Phone             Notes  [facility] [place] - variety of support groups  [place] Call for more information  Narcotics Anonymous (NA), [facility] [address], [employer] [place]  2 meetings at this location   Special educational needs teacher         Address  Phone  Notes  [facility] [place],    [place]  [phone]   [facility]  [address], [place] [address]93, [place], [place]   [place], [place] [phone] Admissions: 8am-3pm M-F  [facility] Substance [place] [address],    [place], [place] [phone]   [facility] [address] [place], [place], [place]   [facility] [address],  [place], [place]   [facility] - Intensive Outpatient [address], [place], [place]   [facility] [place].,  [place], [place] [phone] or [phone]   [facility] ([facility]) [address], [place], [place] Accepts Medicaid  [facility] [place] [address],  [place] [phone] Substance Abuse/Addiction Treatment   [facility] Organization         Address  Phone  Notes  [facility]  [phone]   Domenic Schwab, PhD [address] [place], [place]   [phone] or [phone]    [facility] [place], [place] [phone]   [facility] [address], [place], [place] [phone] Insurance/Medicaid/sponsorship through [facility] and Families [address]                                    [place], [place] [phone] [place] [address], [place] [phone]    Dr. Adele Schilder  [phone]   [facility] Dept. 1) [address] [place] 2) [place] 3)  [address], [place] [phone]  [phone]   [place] [phone] or [phone] (After Hours)

## 2014-03-24 ENCOUNTER — Encounter (HOSPITAL_COMMUNITY): Payer: Self-pay | Admitting: Emergency Medicine

## 2014-03-24 ENCOUNTER — Emergency Department (HOSPITAL_COMMUNITY)
Admission: EM | Admit: 2014-03-24 | Discharge: 2014-03-25 | Disposition: A | Discharge status: Home / Self Care | Payer: Self-pay | Attending: Emergency Medicine | Admitting: Emergency Medicine

## 2014-03-24 DIAGNOSIS — Z720 Tobacco use: Secondary | ICD-10-CM | POA: Insufficient documentation

## 2014-03-24 DIAGNOSIS — F1410 Cocaine abuse, uncomplicated: Secondary | ICD-10-CM | POA: Insufficient documentation

## 2014-03-24 DIAGNOSIS — Z791 Long term (current) use of non-steroidal anti-inflammatories (NSAID): Secondary | ICD-10-CM | POA: Insufficient documentation

## 2014-03-24 DIAGNOSIS — Z8659 Personal history of other mental and behavioral disorders: Secondary | ICD-10-CM | POA: Insufficient documentation

## 2014-03-24 DIAGNOSIS — Z79899 Other long term (current) drug therapy: Secondary | ICD-10-CM | POA: Insufficient documentation

## 2014-03-24 DIAGNOSIS — I10 Essential (primary) hypertension: Secondary | ICD-10-CM | POA: Insufficient documentation

## 2014-03-24 DIAGNOSIS — F1310 Sedative, hypnotic or anxiolytic abuse, uncomplicated: Secondary | ICD-10-CM | POA: Insufficient documentation

## 2014-03-24 DIAGNOSIS — F1910 Other psychoactive substance abuse, uncomplicated: Secondary | ICD-10-CM

## 2014-03-24 MED ORDER — LORAZEPAM 1 MG PO TABS
1.0000 mg | ORAL_TABLET | Freq: Three times a day (TID) | ORAL | Status: DC | PRN
  Administered 2014-03-25: 1 mg via ORAL
  Filled 2014-03-24: qty 1

## 2014-03-24 MED ORDER — NICOTINE 21 MG/24HR TD PT24
21.0000 mg | MEDICATED_PATCH | Freq: Every day | TRANSDERMAL | Status: DC
  Administered 2014-03-25: 21 mg via TRANSDERMAL
  Filled 2014-03-24: qty 1

## 2014-03-24 MED ORDER — ONDANSETRON HCL 4 MG PO TABS: 4.0000 mg | ORAL_TABLET | Freq: Three times a day (TID) | ORAL | Status: DC | PRN

## 2014-03-24 NOTE — ED Notes (Signed)
Pt states he is here for detox from pain meds, xanax, and cocaine  Pt states he last took pain pills 2 days ago and last used cocaine last night  Pt states he has not eaten in 3 days  Pt states if he cannot get off the pain pills he is going to end up killing himself  Pt states he was here for same before and did not follow through with it but he wants to this time  Pt states his plan is too take too many pills and end it all
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
## 2014-03-25 LAB — CBC WITH DIFFERENTIAL/PLATELET
BASOS ABS: 0 10*3/uL (ref 0.0–0.1)
Basophils Relative: 0 % (ref 0–1)
EOS PCT: 8 % — AB (ref 0–5)
Eosinophils Absolute: 0.6 10*3/uL (ref 0.0–0.7)
HEMATOCRIT: 48.8 % (ref 39.0–52.0)
Hemoglobin: 16.3 g/dL (ref 13.0–17.0)
LYMPHS PCT: 43 % (ref 12–46)
Lymphs Abs: 3 10*3/uL (ref 0.7–4.0)
MCH: 27.9 pg (ref 26.0–34.0)
MCHC: 33.4 g/dL (ref 30.0–36.0)
MCV: 83.4 fL (ref 78.0–100.0)
Monocytes Absolute: 0.4 10*3/uL (ref 0.1–1.0)
Monocytes Relative: 6 % (ref 3–12)
Neutro Abs: 2.9 10*3/uL (ref 1.7–7.7)
Neutrophils Relative %: 43 % (ref 43–77)
PLATELETS: 234 10*3/uL (ref 150–400)
RBC: 5.85 MIL/uL — AB (ref 4.22–5.81)
RDW: 12.9 % (ref 11.5–15.5)
WBC: 6.9 10*3/uL (ref 4.0–10.5)

## 2014-03-25 LAB — COMPREHENSIVE METABOLIC PANEL
ALBUMIN: 4.5 g/dL (ref 3.5–5.2)
ALK PHOS: 53 U/L (ref 39–117)
ALT: 45 U/L (ref 0–53)
AST: 26 U/L (ref 0–37)
Anion gap: 8 (ref 5–15)
BUN: 15 mg/dL (ref 6–23)
CO2: 25 mmol/L (ref 19–32)
CREATININE: 1.1 mg/dL (ref 0.50–1.35)
Calcium: 9.4 mg/dL (ref 8.4–10.5)
Chloride: 105 mmol/L (ref 96–112)
GLUCOSE: 102 mg/dL — AB (ref 70–99)
Potassium: 3.6 mmol/L (ref 3.5–5.1)
Sodium: 138 mmol/L (ref 135–145)
Total Bilirubin: 0.7 mg/dL (ref 0.3–1.2)
Total Protein: 7.6 g/dL (ref 6.0–8.3)

## 2014-03-25 LAB — ACETAMINOPHEN LEVEL: Acetaminophen (Tylenol), Serum: <10 ug/mL — ABNORMAL LOW (ref 10–30)

## 2014-03-25 LAB — RAPID URINE DRUG SCREEN, HOSP PERFORMED
Amphetamines: NOT DETECTED
BARBITURATES: NOT DETECTED
Benzodiazepines: POSITIVE — AB
Cocaine: POSITIVE — AB
OPIATES: NOT DETECTED
Tetrahydrocannabinol: NOT DETECTED

## 2014-03-25 LAB — SALICYLATE LEVEL

## 2014-03-25 LAB — ETHANOL: Alcohol, Ethyl (B): <5 mg/dL (ref 0–9)

## 2014-03-25 MED ORDER — ACETAMINOPHEN 325 MG PO TABS
650.0000 mg | ORAL_TABLET | Freq: Once | ORAL | Status: AC
  Administered 2014-03-25: 650 mg via ORAL
  Filled 2014-03-25: qty 2

## 2014-03-25 NOTE — ED Notes (Signed)
Patient reports that "I want to hurt myself", denies HI, AVH at present. Rates feelings of anxiety and depression 10/10. States that he has insomnia "especially without pain meds". Reports that his appetite is poor and that he has lost ~ 30 lbs over last 3 months. States appetite is decreased further without pills.   Encouragement offered. Sandwich given per request.  Q 15 safety checks in place.

## 2014-03-25 NOTE — BH Assessment (Addendum)
Tele Assessment Note   Luis Wall is a 29 y.o., Caucasian, married male presenting to [facility] with c/o suicidal ideation and withdrawal from prescription pain pills. Pt is requesting detox from opioid pain meds and anxiolytics. Pt reportedly has a hx of diagnoses of Bipolar Disorder and Anxiety. Pt reports suicidal thoughts for the past week with passive thoughts to overdose but no reported intent. Pt reports that his current depression and SI are due to the stress of his drug addiction; he also reports a lot of recent marital stress, stating that he does not want to be with his wife anymore. She is reportedly dx with Bipolar Disorder as well and sent him a text saying she wanted to kill him.   Pt states that he has been using opioid pain medications and cocaine since age 21. He states that he usually uses 8-10 tablets of percocet or vicodin per day for a total of about  daily. He denies IV drug use. He reports consuming on average a 6 pack of beer per week. He reports daily cocaine use for the past month, adding that he does not know an amount but that it is "a lot". Patient denies HI. Denies hx of counseling in the past. He reports going to the [facility] in 2014 for depression and SA but no other treatment or hospitalizations. Pt denies any prior suicide attempts or gestures. Pt denies legal hx.  Per Marjory Sneddon, PA, pt meet inpt criteria for detox/mood disorder. No [facility] beds. TTS to seek placement.  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 Hypertension   . Anxiety   . Anxiety    Axis IV: Economic problems, occupational problems, other psychosocial or environmental problems, problems related to social environment, problems with  primary support group, marital discord Axis V: 41-50 serious symptoms  Past Medical History:  Past Medical History  Diagnosis Date  . Hypertension   . Anxiety   . Anxiety     Past Surgical History  Procedure Laterality Date  . Tonsillectomy    . Wisdom tooth extraction      Family History:  Family History  Problem Relation Age of Onset  . Hypertension Mother     Social History:  reports that he has been smoking Cigarettes.  He has been smoking about 0.50 packs per day. He has never used smokeless tobacco. He reports that he drinks alcohol. He reports that he uses illicit drugs (Cocaine).  Additional Social History:  Alcohol / Drug Use Pain Medications: Percocet, Vicodin (recreational use) Prescriptions: See PTA List Over the Counter: See PTA List History of alcohol / drug use?: Yes Longest period of sobriety (when/how long): None since pt started using at age 60 Negative Consequences of Use: Financial, Personal relationships, Work / Programmer, multimedia Withdrawal Symptoms: Agitation, Patient aware of relationship between substance abuse and physical/medical complications, Irritability, Sweats, Nausea / Vomiting, Other (Comment) (Inability to sleep) Substance #1 Name of Substance 1: Benzodiazepine (Xanax, Ativan) 1 - Age of First Use: 17 1 - Amount (size/oz): 8-10mg  total 1 - Frequency: Daily 1 - Duration: Since age 28 1 - Last Use / Amount: Today Substance #2 Name of Substance 2: Opioids (Percocet,  Vicodin) 2 - Age of First Use: 17 2 - Amount (size/oz): 80mg  total 2 - Frequency: Daily 2 - Duration: Since age 29 2 - Last Use / Amount: Yesterday Substance #3 Name of Substance 3: Cocaine 3 - Age of First Use: 17 3 - Amount (size/oz): "A lot" (pt unsure) 3 - Frequency: Daily 3 - Duration: 1 month 3 - Last Use / Amount: This morning  CIWA: CIWA-Ar BP: 138/95 mmHg Pulse Rate: 90 COWS:    PATIENT STRENGTHS: (choose at least two) Ability for insight Average or above average  intelligence Capable of independent living Communication skills Financial means General fund of knowledge Motivation for treatment/growth Physical Health Special hobby/interest Supportive family/friends Work skills  Allergies:  Allergies  Allergen Reactions  . Flexeril [Cyclobenzaprine] Other (See Comments)    nightmares  . Ibuprofen Nausea Only  . Naproxen Nausea And Vomiting  . Tramadol Itching and Nausea Only  . Vicodin [Hydrocodone-Acetaminophen] Nausea Only    Pt reports "Oxycodone is the only pain med that doesn't make me sick"    Home Medications:  (Not in a hospital admission)  OB/GYN Status:  No LMP for male patient.  General Assessment Data Location of Assessment: [facility] ED Is this a Tele or Face-to-Face Assessment?: Face-to-Face Is this an Initial Assessment or a Re-assessment for this encounter?: Initial Assessment Living Arrangements: Spouse/significant other, Children Can pt return to current living arrangement?: Yes Admission Status: Voluntary Is patient capable of signing voluntary admission?: Yes Transfer from: Home Referral Source: Self/Family/Friend     [facility] Living Arrangements: Spouse/significant other, Children Name of Psychiatrist: None Name of Therapist: None  Education Status Is patient currently in school?: No Current Grade: na Highest grade of school patient has completed: 9 Name of school: na Contact person: na  Risk to self with the past 6 months Suicidal Ideation: Yes-Currently Present Suicidal Intent: No Is patient at risk for suicide?: Yes Suicidal Plan?: Yes-Currently Present Specify Current Suicidal Plan: overdose Access to Means: Yes Specify Access to Suicidal Means: medications What has been your use of drugs/alcohol within the last 12 months?: Daily use of prescription pills and cocaine, Use of etoh Previous Attempts/Gestures: No How many times?: 0 Other Self Harm Risks: SA, impulsivity Triggers for Past  Attempts:  (n/a) Intentional Self Injurious Behavior: None Family Suicide History: No Recent stressful life event(s): Conflict (Comment), Other (Comment) (conflict with wife, drug addiction) Persecutory voices/beliefs?: No Depression: Yes Depression Symptoms: Despondent, Insomnia, Isolating, Feeling worthless/self pity, Feeling angry/irritable Substance abuse history and/or treatment for substance abuse?: Yes Suicide prevention information given to non-admitted patients: Not applicable  Risk to Others within the past 6 months Homicidal Ideation: No Thoughts of Harm to Others: No Current Homicidal Intent: No Current Homicidal Plan: No Access to Homicidal Means: No Identified Victim: n/a History of harm to others?: No Assessment of Violence: In past 6-12 months Violent Behavior Description: Recently seen in ED for punching a wooden pole when angry; Denies violence towards others Does patient have access to weapons?: No Criminal Charges Pending?: No Does patient have a court date: No  Psychosis Hallucinations: None noted (Pt reports sometimes hearing voices only with withdrawing) Delusions: None noted  Mental Status Report Appear/Hygiene: Unremarkable, In scrubs Eye Contact: Poor Motor Activity: Restlessness Speech: Logical/coherent, Soft Level of Consciousness: Restless Mood: Depressed, Anxious Affect: Appropriate to circumstance Anxiety Level: Moderate Thought Processes: Coherent, Relevant Judgement: Partial Orientation: Person, Place, Time, Situation Obsessive Compulsive Thoughts/Behaviors: None  Cognitive Functioning Concentration: Decreased Memory: Unable to Assess  IQ: Average Insight: Fair Impulse Control: Fair Appetite: Poor Weight Loss: 30 Weight Gain: 0 Sleep: No Change Total Hours of Sleep: 8 (But cannot sleep without pain pills) Vegetative Symptoms: None  ADLScreening [facility] Assessment Services) Patient's cognitive ability adequate to safely complete  daily activities?: Yes Patient able to express need for assistance with ADLs?: Yes Independently performs ADLs?: Yes (appropriate for developmental age)  Prior Inpatient Therapy Prior Inpatient Therapy: Yes Prior Therapy Dates: 2014 Prior Therapy Facilty/Provider(s): [facility] Reason for Treatment: Depression, SA  Prior Outpatient Therapy Prior Outpatient Therapy: No  ADL Screening (condition at time of admission) Patient's cognitive ability adequate to safely complete daily activities?: Yes Is the patient deaf or have difficulty hearing?: No Does the patient have difficulty seeing, even when wearing glasses/contacts?: No Does the patient have difficulty concentrating, remembering, or making decisions?: No Patient able to express need for assistance with ADLs?: Yes Does the patient have difficulty dressing or bathing?: No Independently performs ADLs?: Yes (appropriate for developmental age) Does the patient have difficulty walking or climbing stairs?: No Weakness of Legs: None Weakness of Arms/Hands: None  Home Assistive Devices/Equipment Home Assistive Devices/Equipment: None    Abuse/Neglect Assessment (Assessment to be complete while patient is alone) Physical Abuse: Denies Verbal Abuse: Denies Sexual Abuse: Denies Exploitation of patient/patient's resources: Denies Self-Neglect: Denies Values / Beliefs Cultural Requests During Hospitalization: None Spiritual Requests During Hospitalization: None   Advance Directives (For Healthcare) Does patient have an advance directive?: No Would patient like information on creating an advanced directive?: No - patient declined information    Additional Information 1:1 In Past 12 Months?: No CIRT Risk: No Elopement Risk: No Does patient have medical clearance?: Yes     Disposition: Per Marjory Sneddon, PA, pt meet inpt criteria for detox/mood disorder. No [facility] beds.  TTS to seek placement.  Disposition Initial  Assessment Completed for this Encounter: Yes Disposition of Patient: Inpatient treatment program Type of inpatient treatment program: Adult (SA/detox)  Cyndie Mull, [facility] Triage Specialist  03/25/2014 1:56 AM

## 2014-03-25 NOTE — [facility] Counselor (Signed)
Pt reported feeling too uncomfortable to stay, stating, "I can do this type of detox at home". TTS Counselor explained how he would have to start the process over if he left now and wanted to come back and that TTS was already in the process of seeking inpt placement for him.   Pt insisted he did not want to stay. TTS assessed for safety. Pt reported no SI/HI or A/VH. He said he initially stated he was suicidal just to get away from his wife. Pt signed no-harm contract and was given OP resources.  TTS Counselor spoke with Antony MaduraKelly Humes, PA about pt being discharged.   Cyndie MullAnna Teylor Wolven, [facility] Triage Specialist

## 2014-03-25 NOTE — ED Notes (Signed)
Pt's Wife took pt's personal belongings home. Including his lip ring and wedding band.

## 2014-03-25 NOTE — ED Notes (Signed)
Patient questioned writer "Do I really have to be here"? States he can do the withdraw at home without meds. Reports that he does not want to be around his wife and questioned if he could go to his sister's. Informed patient that his safety has to be evaluated.   Notified Tobi Bastosnna, TTS of patient question/comments.  Q 15 safety checks continue.

## 2014-03-25 NOTE — Discharge Instructions (Signed)
Polysubstance Abuse °When people abuse more than one drug or type of drug it is called polysubstance or polydrug abuse. For example, many smokers also drink alcohol. This is one form of polydrug abuse. Polydrug abuse also refers to the use of a drug to counteract an unpleasant effect produced by another drug. It may also be used to help with withdrawal from another drug. People who take stimulants may become agitated. Sometimes this agitation is countered with a tranquilizer. This helps protect against the unpleasant side effects. Polydrug abuse also refers to the use of different drugs at the same time.  °Anytime drug use is interfering with normal living activities, it has become abuse. This includes problems with family and friends. Psychological dependence has developed when your mind tells you that the drug is needed. This is usually followed by physical dependence which has developed when continuing increases of drug are required to get the same feeling or "high". This is known as addiction or chemical dependency. A person's risk is much higher if there is a history of chemical dependency in the family. °SIGNS OF CHEMICAL DEPENDENCY °· You have been told by friends or family that drugs have become a problem. °· You fight when using drugs. °· You are having blackouts (not remembering what you do while using). °· You feel sick from using drugs but continue using. °· You lie about use or amounts of drugs (chemicals) used. °· You need chemicals to get you going. °· You are suffering in work performance or in school because of drug use. °· You get sick from use of drugs but continue to use anyway. °· You need drugs to relate to people or feel comfortable in social situations. °· You use drugs to forget problems. °"Yes" answered to any of the above signs of chemical dependency indicates there are problems. The longer the use of drugs continues, the greater the problems will become. °If there is a family history of  drug or alcohol use, it is best not to experiment with these drugs. Continual use leads to tolerance. After tolerance develops more of the drug is needed to get the same feeling. This is followed by addiction. With addiction, drugs become the most important part of life. It becomes more important to take drugs than participate in the other usual activities of life. This includes relating to friends and family. Addiction is followed by dependency. Dependency is a condition where drugs are now needed not just to get high, but to feel normal. °Addiction cannot be cured but it can be stopped. This often requires outside help and the care of professionals. Treatment centers are listed in the yellow pages under: Cocaine, Narcotics, and Alcoholics Anonymous. Most hospitals and clinics can refer you to a specialized care center. Talk to your caregiver if you need help. °Document Released: 09/05/2004 Document Revised: 04/08/2011 Document Reviewed: 01/14/2005 °ExitCare® Patient Information ©2015 [employer]. This information is not intended to replace advice given to you by your health care provider. Make sure you discuss any questions you have with your health care provider. ° ° ° °Emergency Department Resource Guide °1) Find a Doctor and Pay Out of Pocket °Although you won't have to find out who is covered by your insurance plan, it is a good idea to ask around and get recommendations. You will then need to call the office and see if the doctor you have chosen will accept you as a new patient and what types of options they offer for patients who   are self-pay. Some doctors offer discounts or will set up payment plans for their patients who do not have insurance, but you will need to ask so you aren't surprised when you get to your appointment. ° °2) Contact Your Local Health Department °Not all health departments have doctors that can see patients for sick visits, but many do, so it is worth a call to see if yours does. If  you don't know where your local health department is, you can check in your phone book. The CDC also has a tool to help you locate your state's health department, and many state websites also have listings of all of their local health departments. ° °3) Find a Walk-in Clinic °If your illness is not likely to be very severe or complicated, you may want to try a walk in clinic. These are popping up all over the country in pharmacies, drugstores, and shopping centers. They're usually staffed by nurse practitioners or physician assistants that have been trained to treat common illnesses and complaints. They're usually fairly quick and inexpensive. However, if you have serious medical issues or chronic medical problems, these are probably not your best option. ° °No Primary Care Doctor: °- Call [facility] at  832-8000 - they can help you locate a primary care doctor that  accepts your insurance, provides certain services, etc. °- Physician Referral Service- 1-800-533-3463 ° °Chronic Pain Problems: °Organization         Address  Phone   Notes  °[facility]  (336) 297-2271 Patients need to be referred by their primary care doctor.  ° °Medication Assistance: °Organization         Address  Phone   Notes  °[place] [facility] [address] °[place], [place] [address] (336) 641-8030 --Must be a resident of [place] °-- Must have NO insurance coverage whatsoever (no Medicaid/ Medicare, etc.) °-- The pt. MUST have a primary care doctor that directs their care regularly and follows them in the community °  °[facility]  (866) 331-1348   °[employer]  (888) 892-1162   ° °Agencies that provide inexpensive medical care: °Organization         Address  Phone   Notes  °[facility]  (336) 832-8035   °[facility]    (336) 832-7272   °[facility] [address] °[place], [place] [address] (336) 832-4777   °[facility] [address], °[place] (336) 271-4999   °[facility]    (336) 373-0678   °[facility]    (336) 272-1050   °[facility] ° [address], [place] Phone:  (336) 832-4444, Fax:  (336) 832-4440 Hours of Operation:  9 am - 6 pm, M-F.  Also accepts Medicaid/Medicare and self-pay.  °[facility] ° [address], [place] Phone: (336) 832-3150, Fax: (336) 832-3151. Hours of Operation:  8:30 am - 5:30 pm, M-F.  Also accepts Medicaid and self-pay.  °[facility] [place] [address], [place] Phone: (336) 878-6027   °[facility] [address], [place], [place] (336)723-1848, Ext. 123 Mondays & Thursdays: 7-9 AM.  First 15 patients are seen on a first come, first serve basis. °  ° °Medicaid-accepting [place] Providers: ° °Organization         Address  Phone   Notes  °[facility] [address], [place] (336) 641-2100 Also accepts self-pay patients.  °[facility] [address], [place] ° (336) 856-9996   °[facility] [address], [place] (336) 288-8857   °[facility] [address] [place] [address], [place] (336) 299-7000   °Veita Bland [address], [place]  ° (336) 373-1557 Only accepts [place] Access Medicaid patients after they have their name applied to their card.  ° °Self-Pay (no insurance) in [place]: ° °Organization         Address  Phone   Notes  °Sickle Cell Patients, [facility] [address], [place] (336) 832-1970   °[facility] [address], [place] (336) 832-4400   °[facility] ° [address], [place] (336) 992-4800   °[facility]/Dr. Osei-Bonsu ° [address] [place] [address], [place] or [address], [place] (336) 841-8500  Phone number for both [place] and [place] locations is the same.  °[facility] [address], [place] (336) 299-0000   °[facility] [address] [place] [address], [place] or [address] (336) 852-7530 °(336) 878-2260   °[facility] [address], [place] (336) 350-1642, phone; (336) 294-5005, fax Sees patients 1st and 3rd Saturday of every month.  Must not qualify for public or private insurance (i.e. Medicaid, Medicare, [place] Health Choice, Veterans' Benefits) • Household income should be no more than 200% of the poverty level •The clinic cannot treat you if you are pregnant or think you are pregnant • Sexually transmitted diseases are not treated at the clinic.  ° ° °Dental Care: °Organization         Address  Phone  Notes  °[facility] [address], [place] (336) 641-6152 Accepts children up to age 21 who are enrolled in Medicaid or [place] Health Choice; pregnant women with a Medicaid card; and children who have applied for Medicaid or [place] Health Choice, but were declined, whose parents can pay a reduced fee at time of service.  °[facility] [place]  [address], [place] (336) 641-7733 Accepts children up to age 21 who are enrolled in Medicaid or [place] Health Choice; pregnant women with a Medicaid card; and children who have applied for Medicaid or  Health Choice, but were declined, whose parents can pay a reduced fee at time of service.  °[facility] ° [address],  (336) 641-4533 Patients are seen by appointment only. Walk-ins are not accepted. [facility] will see patients 18 years of age and older. °Monday - Tuesday (8am-5pm) °Most Wednesdays (8:30-5pm) °$30 per visit, cash only  °[facility] ° [address], [place] (336) 641-4533 Patients are seen by appointment  only. Walk-ins are not accepted. [facility] will see patients 18 years of age and older. °One Wednesday Evening (Monthly: Volunteer Based).  $30 per visit, cash only  °[facility]  (919) 537-3737 for adults; Children under age 4, call Graduate Pediatric Dentistry at (919) [phone]. Children aged 4-14, please call (919) [phone] to request a pediatric application. ° Dental services are provided in all areas of dental care including fillings, crowns and bridges, complete and partial dentures, implants, gum treatment, root canals,   and extractions. Preventive care is also provided. Treatment is provided to both adults and children. °Patients are selected via a lottery and there is often a waiting list. °  °[facility] [address], °[place] ° (336) 763-8833 [email or web address] °  °[facility] [address], [place], [place] (336)723-1848, Ext. 123 Second and Fourth Thursday of each month, opens at 6:30 AM; Clinic ends at 9 AM.  Patients are seen on a first-come first-served basis, and a limited number are seen during each clinic.  ° °[facility] ° [address], [place], [place] (336) 723-7904   Eligibility Requirements °You must have lived in [place], [place], or [place] counties for at least the last three months. °  You cannot be eligible for state or federal sponsored healthcare insurance, including Veterans Administration, Medicaid, or Medicare. °  You generally cannot be eligible for healthcare insurance through your employer.  °  How to apply: °Eligibility screenings are held every Tuesday and Wednesday afternoon from 1:00 pm until 4:00 pm. You do not need an appointment for the interview!  °[facility] [address], [place], [place] 336-631-2330   °[facility]  336-342-8273   °[facility]  336-703-3100   °[facility]  336-570-6415   ° °Behavioral Health  Resources in the Community: °Intensive Outpatient Programs °Organization         Address  Phone  Notes  °[facility] [address], [place], [place] 336-878-6098   °[facility] [address], [place], [place] 336-832-9800   °[facility] [address], [place], [place] ° 336-882-2125   °[facility] [address],  °[place], [place] 1-800-853-5163 or 336-641-4981   °Substance Abuse Resources °Organization         Address  Phone  Notes  °[facility]  336-882-2125   °[facility]  336-784-9470   °[facility]  336-285-9073   °[facility]  336-845-3988   °Residential & Outpatient Substance Abuse Program  1-800-659-3381   °Psychological Services °Organization         Address  Phone  Notes  °[facility]  336- 832-9600   °[facility]  336- 378-7881   °[facility] [address], [place] 1-800-853-5163 or 336-641-4981   ° °Mobile Crisis Teams °Organization         Address  Phone  Notes  °[facility], Mobile Crisis Care Unit  1-877-626-1772   °Assertive °[facility] ° [address] [place], [place] 336-834-9664   °Sharon DeEsch [address] °[place] 336-554-5454   ° °Self-Help/Support Groups °Organization         Address  Phone             Notes  °[facility] - variety of support groups  336- 373-1402 Call for more information  °Narcotics Anonymous (NA), [facility] [address], °[place]  2 meetings at this location  ° °Residential Treatment Programs °Organization         Address  Phone  Notes  °[facility] [address],    °[place]  1-866-801-8205   °[facility] ° [address], [place],  704-293-8524   °[facility] [address], [place] 336-845-3988 Admissions: 8am-3pm M-F  °[facility] [address]    °[place], [place] 336-841-1104   °[facility] [address], [place], [place] 336-379-7146   °[facility] [address]  °[place], [place] 336-285-9073   °[facility] Intensive Outpatient [address], [place], [place] 336-852-3033   °[facility] [address]  °[place], [place] 1-877-615-2722 or 336-784-9470   °[facility] [address] [place], [place] 336-227-7417 Accepts Medicaid  °[facility] [address]  °[place] 1-800-659-3381 Substance Abuse/Addiction Treatment  ° °[place] Behavioral Health Resources °Organization         Address  Phone  Notes  °[facility]  (888) 581-9988   °Julie Brannon, PhD [address] [place], [place]   (336) 349-5553 or (336) 951-0000   °[facility]   [address] °[place], [place] (336) 349-4454   °[facility] [address], [place], [place] (336) 342-8316 Insurance/Medicaid/sponsorship through [facility]  °[facility] [address]                                    [place], [place] (336) 342-8316 Therapy/tele-psych/case  °[facility] [address]  ° [place], [place] (336) 349-2233    °Dr. Arfeen  (336) 349-4544   °[facility] [place]  [employer] [place] [facility] 1) [address],  °2) [address], [place] °3)  [address], [place] (336) 349-3220 °(336) 342-7768 ° °(336) 342-8140   °[place] Child Abuse [facility] (336) 342-1394 or (336) 342-3537 (After Hours)    ° ° ° °

## 2014-03-25 NOTE — ED Notes (Signed)
TTS in with patient.  

## 2014-03-25 NOTE — [facility] Counselor (Signed)
Per Marjory SneddonIjeama Nwaeza, PA, pt meet inpt criteria for detox/mood disorder. No [facility] beds. TTS to seek placement.   Cyndie MullAnna Cariana Karge, [facility] Triage Specialist

## 2014-04-09 ENCOUNTER — Emergency Department (HOSPITAL_COMMUNITY)
Admission: EM | Admit: 2014-04-09 | Discharge: 2014-04-09 | Disposition: A | Discharge status: Home / Self Care | Payer: Self-pay | Attending: Emergency Medicine | Admitting: Emergency Medicine

## 2014-04-09 ENCOUNTER — Encounter (HOSPITAL_COMMUNITY): Payer: Self-pay | Admitting: Emergency Medicine

## 2014-04-09 DIAGNOSIS — I10 Essential (primary) hypertension: Secondary | ICD-10-CM | POA: Insufficient documentation

## 2014-04-09 DIAGNOSIS — J340 Abscess, furuncle and carbuncle of nose: Secondary | ICD-10-CM | POA: Insufficient documentation

## 2014-04-09 DIAGNOSIS — Z79899 Other long term (current) drug therapy: Secondary | ICD-10-CM | POA: Insufficient documentation

## 2014-04-09 DIAGNOSIS — Z791 Long term (current) use of non-steroidal anti-inflammatories (NSAID): Secondary | ICD-10-CM | POA: Insufficient documentation

## 2014-04-09 DIAGNOSIS — Z720 Tobacco use: Secondary | ICD-10-CM | POA: Insufficient documentation

## 2014-04-09 DIAGNOSIS — Z8659 Personal history of other mental and behavioral disorders: Secondary | ICD-10-CM | POA: Insufficient documentation

## 2014-04-09 MED ORDER — CEPHALEXIN 500 MG PO CAPS: 500.0000 mg | ORAL_CAPSULE | Freq: Three times a day (TID) | ORAL | Status: DC

## 2014-04-09 MED ORDER — SULFAMETHOXAZOLE-TRIMETHOPRIM 800-160 MG PO TABS
1.0000 | ORAL_TABLET | Freq: Once | ORAL | Status: AC
  Administered 2014-04-09: 1 via ORAL
  Filled 2014-04-09: qty 1

## 2014-04-09 MED ORDER — CEPHALEXIN 500 MG PO CAPS
1000.0000 mg | ORAL_CAPSULE | Freq: Once | ORAL | Status: AC
  Administered 2014-04-09: 1000 mg via ORAL
  Filled 2014-04-09: qty 2

## 2014-04-09 MED ORDER — SULFAMETHOXAZOLE-TRIMETHOPRIM 800-160 MG PO TABS: 1.0000 | ORAL_TABLET | Freq: Two times a day (BID) | ORAL | Status: DC

## 2014-04-09 NOTE — Discharge Instructions (Signed)

## 2014-04-09 NOTE — ED Notes (Signed)
Pt states he had a sore in his nose.  Pt states that he scratched it and woke up this morning with painful and swollen nose.

## 2014-04-09 NOTE — ED Provider Notes (Signed)
CSN: [id number]     Arrival date & time 04/09/14  1053 History   First MD Initiated Contact with Patient 04/09/14 1103     Chief Complaint  Patient presents with  . Facial Swelling     (Consider location/radiation/quality/duration/timing/severity/associated sxs/prior Treatment) HPI   Luis Wall is a(n) 29 y.o. male who presents to the emergency department with chief complaint of nasal pain and facial swelling. He has a past medical history of anxiety, hypertension and substance abuse. The patient states that yesterday he had a sore in the base of his left nare. He states he thinks he may have scratched it. This morning he noticed that he had more pain, swelling of the nasal septum and a lot and extension into the left upper lip and face. He denies fevers, chills, nausea, vomiting, myalgias or other signs of systemic infection. He denies any difficulty breathing. He denies any dental pain or infections.   Past Medical History  Diagnosis Date  . Hypertension   . Anxiety   . Anxiety    Past Surgical History  Procedure Laterality Date  . Tonsillectomy    . Wisdom tooth extraction     Family History  Problem Relation Age of Onset  . Hypertension Mother    History  Substance Use Topics  . Smoking status: Current Every Day Smoker -- 0.50 packs/day    Types: Cigarettes  . Smokeless tobacco: Never Used  . Alcohol Use: Yes     Comment: once a week     Review of Systems  Ten systems reviewed and are negative for acute change, except as noted in the HPI.    Allergies  Flexeril; Ibuprofen; Naproxen; Tramadol; and Vicodin  Home Medications   Prior to Admission medications   Medication Sig Start Date End Date Taking? Authorizing Provider  cephALEXin (KEFLEX) 500 MG capsule Take 1 capsule (500 mg total) by mouth 3 (three) times daily. 04/09/14   Arthor CaptainAbigail Loc Feinstein, PA-C  diclofenac (VOLTAREN) 75 MG EC tablet Take 1 tablet (75 mg total) by mouth 2 (two) times daily. Patient not  taking: Reported on 01/17/2014 11/11/13   Emilia BeckKaitlyn Szekalski, PA-C  ibuprofen (ADVIL,MOTRIN) 200 MG tablet Take 600 mg by mouth every 6 (six) hours as needed (For pain.).     Historical Provider, MD  methocarbamol (ROBAXIN) 500 MG tablet Take 1 tablet (500 mg total) by mouth 2 (two) times daily. Patient not taking: Reported on 03/25/2014 01/17/14   Glynn OctaveStephen Rancour, MD  sulfamethoxazole-trimethoprim (SEPTRA DS) 800-160 MG per tablet Take 1 tablet by mouth every 12 (twelve) hours. 04/09/14   Daevon Holdren, PA-C   BP 144/88 mmHg  Pulse 85  Temp(Src) 97.5 F (36.4 C) (Oral)  Resp 17  SpO2 100% Physical Exam  Constitutional: He appears well-developed and well-nourished. No distress.  HENT:  Head: Normocephalic and atraumatic.  Nose: Mucosal edema and sinus tenderness present.    Small crusted sore at the base of the left Nare. There is induration, redness, no fluctuance, swelling of that a lap and distal left septum. There is some swelling surrounding the left [facility] into the philtrum, lip and left cheek without heat, redness, or swelling. No signs of erysipelas.  Eyes: Conjunctivae are normal. No scleral icterus.  Neck: Normal range of motion. Neck supple.  Cardiovascular: Normal rate, regular rhythm and normal heart sounds.   Pulmonary/Chest: Effort normal and breath sounds normal. No respiratory distress.  Abdominal: Soft. There is no tenderness.  Musculoskeletal: He exhibits no edema.  Neurological: He is  alert.  Skin: Skin is warm and dry. He is not diaphoretic.  Psychiatric: His behavior is normal.  Nursing note and vitals reviewed.   ED Course  Procedures (including critical care time) Labs Review Labs Reviewed - No data to display  Imaging Review No results found.   EKG Interpretation None      MDM   Final diagnoses:  Cellulitis of nasal tip    Patient here for complaint of nasal infection. No signs of erysipelas. Infection appears localized. Possible developing  abscess, although there is no fluctuance at this time. Hemodynamically stable and afebrile. Patient will be started on Bactrim and Keflex. I have discussed return precautions with the patient to include worsening signs of infection, chills or signs of systemic infection. Patient is to follow-up with Dr. Suszanne Conners. Discharged with Bactrim and Keflex for MRSA and strep coverage. Encouraged warm wet compresses. Patient appears safe for discharge at this time.   Arthor Captain, PA-C 04/09/14 1131  Elwin Mocha, MD 04/09/14 [id number]

## 2014-08-31 ENCOUNTER — Emergency Department (HOSPITAL_COMMUNITY)
Admission: EM | Admit: 2014-08-31 | Discharge: 2014-08-31 | Disposition: A | Discharge status: Home / Self Care | Payer: Self-pay | Attending: Emergency Medicine | Admitting: Emergency Medicine

## 2014-08-31 ENCOUNTER — Encounter (HOSPITAL_COMMUNITY): Payer: Self-pay | Admitting: Emergency Medicine

## 2014-08-31 DIAGNOSIS — F1910 Other psychoactive substance abuse, uncomplicated: Secondary | ICD-10-CM

## 2014-08-31 DIAGNOSIS — Z720 Tobacco use: Secondary | ICD-10-CM | POA: Insufficient documentation

## 2014-08-31 DIAGNOSIS — Z8659 Personal history of other mental and behavioral disorders: Secondary | ICD-10-CM | POA: Insufficient documentation

## 2014-08-31 DIAGNOSIS — F1110 Opioid abuse, uncomplicated: Secondary | ICD-10-CM | POA: Insufficient documentation

## 2014-08-31 DIAGNOSIS — I10 Essential (primary) hypertension: Secondary | ICD-10-CM | POA: Insufficient documentation

## 2014-08-31 NOTE — ED Notes (Signed)
Per pt, states he has been snorting heroin and pain pills-wants help for depression and addiction

## 2014-08-31 NOTE — ED Provider Notes (Signed)
CSN: [id number]     Arrival date & time 08/31/14  1658 History   First MD Initiated Contact with Patient 08/31/14 1735     Chief Complaint  Patient presents with  . Depression  . detox       HPI  Patient presents for evaluation with a complaint of drug addiction and depression. He states that he "no street drugs everyday". States that he snorts heroin. Lasted this 2 days ago. He states that he takes daily OxyContin, or Vicodin. Gets this on the street. States he would like "detox".  He states "I don't feel like I really need Vicodin, if you can just gives me some Tylenol No. 3s that would be great".  States that he wakes up in the morning alprazolam. States he does not and has not thought of hurting himself. He does not take drugs that intention of self-harm.  He states that "last time I came here or to [facility], they gave me Vicodin, or Valium".  Past Medical History  Diagnosis Date  . Hypertension   . Anxiety   . Anxiety    Past Surgical History  Procedure Laterality Date  . Tonsillectomy    . Wisdom tooth extraction     Family History  Problem Relation Age of Onset  . Hypertension Mother    History  Substance Use Topics  . Smoking status: Current Every Day Smoker -- 0.50 packs/day    Types: Cigarettes  . Smokeless tobacco: Never Used  . Alcohol Use: Yes     Comment: once a week     Review of Systems  Constitutional: Negative for fever, chills, diaphoresis, appetite change and fatigue.  HENT: Negative for mouth sores, sore throat and trouble swallowing.   Eyes: Negative for visual disturbance.  Respiratory: Negative for cough, chest tightness, shortness of breath and wheezing.   Cardiovascular: Negative for chest pain.  Gastrointestinal: Negative for nausea, vomiting, abdominal pain, diarrhea and abdominal distention.  Endocrine: Negative for polydipsia, polyphagia and polyuria.  Genitourinary: Negative for dysuria, frequency and hematuria.   Musculoskeletal: Negative for gait problem.  Skin: Negative for color change, pallor and rash.  Neurological: Negative for dizziness, syncope, light-headedness and headaches.  Hematological: Does not bruise/bleed easily.  Psychiatric/Behavioral: Positive for dysphoric mood. Negative for suicidal ideas, behavioral problems, confusion, sleep disturbance and self-injury.      Allergies  Flexeril; Ibuprofen; Naproxen; Tramadol; and Vicodin  Home Medications   Prior to Admission medications   Medication Sig Start Date End Date Taking? Authorizing Provider  HYDROcodone-acetaminophen (NORCO) 10-325 MG per tablet Take 1 tablet by mouth every 6 (six) hours as needed (addiction).   Yes Historical Provider, MD  oxycodone (ROXICODONE) 30 MG immediate release tablet Take 30 mg by mouth every 2 (two) hours as needed for pain (addiction).   Yes Historical Provider, MD  cephALEXin (KEFLEX) 500 MG capsule Take 1 capsule (500 mg total) by mouth 3 (three) times daily. Patient not taking: Reported on 08/31/2014 04/09/14   Arthor Captain, PA-C  diclofenac (VOLTAREN) 75 MG EC tablet Take 1 tablet (75 mg total) by mouth 2 (two) times daily. Patient not taking: Reported on 01/17/2014 11/11/13   Emilia Beck, PA-C  methocarbamol (ROBAXIN) 500 MG tablet Take 1 tablet (500 mg total) by mouth 2 (two) times daily. Patient not taking: Reported on 03/25/2014 01/17/14   Glynn Octave, MD  sulfamethoxazole-trimethoprim (SEPTRA DS) 800-160 MG per tablet Take 1 tablet by mouth every 12 (twelve) hours. Patient not taking: Reported on 08/31/2014  04/09/14   Abigail Harris, PA-C   BP 121/80 mmHg  Pulse 95  Temp(Src) 99.2 F (37.3 C) (Oral)  Resp 18  SpO2 96% Physical Exam  Constitutional: He is oriented to person, place, and time. He appears well-developed and well-nourished. No distress.  HENT:  Head: Normocephalic.  Eyes: Conjunctivae are normal. Pupils are equal, round, and reactive to light. No scleral icterus.   Neck: Normal range of motion. Neck supple. No thyromegaly present.  Cardiovascular: Normal rate and regular rhythm.  Exam reveals no gallop and no friction rub.   No murmur heard. Pulmonary/Chest: Effort normal and breath sounds normal. No respiratory distress. He has no wheezes. He has no rales.  Abdominal: Soft. Bowel sounds are normal. He exhibits no distension. There is no tenderness. There is no rebound.  Musculoskeletal: Normal range of motion.  Neurological: He is alert and oriented to person, place, and time.  Skin: Skin is warm and dry. No rash noted.  Psychiatric: He has a normal mood and affect. His behavior is normal.    ED Course  Procedures (including critical care time) Labs Review Labs Reviewed - No data to display  Imaging Review No results found.   EKG Interpretation None      MDM   Final diagnoses:  Polysubstance abuse    Patient not tachycardic. States his withdrawal symptoms are "cravings, and sweats, and diarrhea".  I discussed at length with him that we could have him evaluated for his depression. On multiple times to reset his thinking as he continuously states that he was "just like something for my craving like Tylenol with Codeine, Suboxone, or methadone".  He is adamant on multiple occasions that he is not suicidal. He appears well. He does states that he lost his job today. He states was related to some Vicodin given to him yesterday at [facility] for a "back contusion". However, he states "I only took 5 of those are through the rest out". He cannot tell me why he did this.  She ultimately states "if you're not going to give me what I want, then I will just leave". I had discussed with him at length that I would treat him with Bentyl, Zofran, and clonidine to help with his abdominal cramps, nausea, and cravings. I was adamant that it would not treat his symptoms with controlled medications, as I felt this was inappropriate. He stated he was  leaving. I'm not concerned at this point that he is homicidal, suicidal or gravely disabled by his condition.    Rolland Porter, MD 08/31/14 1806

## 2014-12-03 ENCOUNTER — Emergency Department
Admission: EM | Admit: 2014-12-03 | Discharge: 2014-12-03 | Disposition: A | Discharge status: Home / Self Care | Payer: Self-pay | Attending: Emergency Medicine | Admitting: Emergency Medicine

## 2014-12-03 ENCOUNTER — Encounter: Payer: Self-pay | Admitting: Emergency Medicine

## 2014-12-03 DIAGNOSIS — M5442 Lumbago with sciatica, left side: Secondary | ICD-10-CM | POA: Insufficient documentation

## 2014-12-03 DIAGNOSIS — G8929 Other chronic pain: Secondary | ICD-10-CM | POA: Insufficient documentation

## 2014-12-03 DIAGNOSIS — I10 Essential (primary) hypertension: Secondary | ICD-10-CM | POA: Insufficient documentation

## 2014-12-03 DIAGNOSIS — Z720 Tobacco use: Secondary | ICD-10-CM | POA: Insufficient documentation

## 2014-12-03 MED ORDER — DIAZEPAM 2 MG PO TABS
2.0000 mg | ORAL_TABLET | Freq: Once | ORAL | Status: AC
  Administered 2014-12-03: 2 mg via ORAL
  Filled 2014-12-03: qty 1

## 2014-12-03 MED ORDER — OXYCODONE-ACETAMINOPHEN 5-325 MG PO TABS
1.0000 | ORAL_TABLET | Freq: Once | ORAL | Status: AC
Start: 2014-12-03 — End: 2014-12-03
  Administered 2014-12-03: 1 via ORAL
  Filled 2014-12-03: qty 1

## 2014-12-03 MED ORDER — DICLOFENAC SODIUM 75 MG PO TBEC: 75.0000 mg | DELAYED_RELEASE_TABLET | Freq: Two times a day (BID) | ORAL | Status: DC

## 2014-12-03 NOTE — ED Notes (Signed)
Pt to ed with c/o lower back pain and left knee pain after moving furniture on Thursday.

## 2014-12-03 NOTE — Discharge Instructions (Signed)
Chronic Back Pain  When back pain lasts longer than 3 months, it is called chronic back pain.People with chronic back pain often go through certain periods that are more intense (flare-ups).  CAUSES Chronic back pain can be caused by wear and tear (degeneration) on different structures in your back. These structures include:  The bones of your spine (vertebrae) and the joints surrounding your spinal cord and nerve roots (facets).  The strong, fibrous tissues that connect your vertebrae (ligaments). Degeneration of these structures may result in pressure on your nerves. This can lead to constant pain. HOME CARE INSTRUCTIONS  Avoid bending, heavy lifting, prolonged sitting, and activities which make the problem worse.  Take brief periods of rest throughout the day to reduce your pain. Lying down or standing usually is better than sitting while you are resting.  Take over-the-counter or prescription medicines only as directed by your caregiver. SEEK IMMEDIATE MEDICAL CARE IF:   You have weakness or numbness in one of your legs or feet.  You have trouble controlling your bladder or bowels.  You have nausea, vomiting, abdominal pain, shortness of breath, or fainting.   This information is not intended to replace advice given to you by your health care provider. Make sure you discuss any questions you have with your health care provider.   Document Released: 02/22/2004 Document Revised: 04/08/2011 Document Reviewed: 07/04/2014 Elsevier Interactive Patient Education 2016 [employer]    Begin taking Voltaren twice a day with food. This is same medications at your doctor had you  taking before. Keep your appointment with her doctor in [place] on Wednesday. Moist heat or ice to back as needed for comfort.

## 2014-12-03 NOTE — ED Notes (Signed)
Discussed discharge instructions, prescriptions, and follow-up care with patient. No questions or concerns at this time. Pt stable at discharge.  

## 2014-12-03 NOTE — ED Provider Notes (Signed)
[facility] Emergency Department Provider Note  ____________________________________________  Time seen: Approximately 3:00 PM  I have reviewed the triage vital signs and the nursing notes.   HISTORY  Chief Complaint Back Pain  HPI Luis Wall is a 29 y.o. male is here with complaint of low back pain after moving some furniture 2 days ago. Currently he is experiencing low back pain with radiation over to his left leg radiating to his left knee. Patient states that he has had a history of chronic back pain and was being treated with oxycodone. He also was taking Flexeril which has given him nightmares. He states he took his last oxycodone last evening but is not taking any muscle relaxants. He states his regular back pain doctors in [place]. He denies any urinary symptoms.He denies any loss of bowel or bladder control. Currently he rates his pain as 9 out of 10. Pain is worse with movement. Resting also does not seem to help with his pain. He states that even though he is planning to move to the [place] area he will continue to keep his doctor in [place].   Past Medical History  Diagnosis Date  . Hypertension   . Anxiety   . Anxiety     Patient Active Problem List   Diagnosis Date Noted  . 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 Tonsillectomy    . Wisdom tooth extraction      Current Outpatient Rx  Name  Route  Sig  Dispense  Refill  . diclofenac (VOLTAREN) 75 MG EC tablet   Oral   Take 1 tablet (75 mg total) by mouth 2 (two) times daily.   20 tablet   0   . HYDROcodone-acetaminophen (NORCO) 10-325 MG per tablet   Oral   Take 1 tablet by mouth every 6 (six) hours as needed (addiction).         Marland Kitchen oxycodone (ROXICODONE) 30 MG immediate release tablet   Oral   Take 30 mg by mouth every 2 (two) hours as needed for pain (addiction).            Allergies Flexeril; Ibuprofen; Naproxen; Tramadol; and Vicodin  Family History  Problem Relation Age of Onset  . Hypertension Mother     Social History Social History  Substance Use Topics  . Smoking status: Current Every Day Smoker -- 0.50 packs/day    Types: Cigarettes  . Smokeless tobacco: Never Used  . Alcohol Use: Yes     Comment: once a week     Review of Systems Constitutional: No fever/chills ENT: No sore throat. Cardiovascular: Denies chest pain. Respiratory: Denies shortness of breath. Gastrointestinal: No abdominal pain.  No nausea, no vomiting.   Genitourinary: Negative for dysuria. Musculoskeletal: Positive for back pain. Skin: Negative for rash. Neurological: Negative for headaches, focal weakness or numbness.  10-point ROS otherwise negative.  ____________________________________________   PHYSICAL EXAM:  VITAL SIGNS: ED Triage Vitals  Enc Vitals Group     BP 12/03/14 1412 136/83 mmHg     Pulse Rate 12/03/14 1412 69     Resp 12/03/14 1412 20     Temp 12/03/14 1412 98.4 F (36.9 C)     Temp Source 12/03/14 1412 Oral     SpO2 12/03/14 1412 100 %     Weight 12/03/14 1412 240 lb (108.863 kg)     Height 12/03/14 1412  (1.854 m)  Head Cir --      Peak Flow --      Pain Score 12/03/14 1413 9     Pain Loc --      Pain Edu? --      Excl. in GC? --     Constitutional: Alert and oriented. Well appearing and in no acute distress. Eyes: Conjunctivae are normal. PERRL. EOMI. Head: Atraumatic. Nose: No congestion/rhinnorhea. Neck: No stridor.   Cardiovascular: Normal rate, regular rhythm. Grossly normal heart sounds.  Good peripheral circulation. Respiratory: Normal respiratory effort.  No retractions. Lungs CTAB. Gastrointestinal: Soft and nontender. No distention. Bowel sounds 4 quadrants within normal limits. Musculoskeletal: Examinations back no gross deformity was noted. There is some guarding with range of motion. There is  moderate tenderness on palpation of the left lumbar paravertebral muscles. No lower extremity tenderness nor edema.  Straight leg raises were proximally 70 with discomfort in the left leg. Neurologic:  Normal speech and language. No gross focal neurologic deficits are appreciated. No gait instability. Reflexes are 1+ bilaterally. Skin:  Skin is warm, dry and intact. No rash noted. Psychiatric: Mood and affect are normal. Speech and behavior are normal.  ____________________________________________   LABS (all labs ordered are listed, but only abnormal results are displayed)  Labs Reviewed - No data to display  PROCEDURES  Procedure(s) performed: None  Critical Care performed: No  ____________________________________________   INITIAL IMPRESSION / ASSESSMENT AND PLAN / ED COURSE  Pertinent labs & imaging results that were available during my care of the patient were reviewed by me and considered in my medical decision making (see chart for details).  In reviewing patient's chart appears that he has been to the emergency room in [place] several times for polysubstance abuse and detox. When discussed with patient he states that this is his brother and not him. His brother has been using his information. Patient was told to display follow-up with his regular "back doctor" on Monday for any continued narcotics. He was given a prescription for Voltaren 75 mg 1 twice a day with food. Records indicate that his doctor has had him on Voltaren in the  past without any problems. Prior to this information patient was given Valium 2 mg and Percocet 5/325. Patient states that he does have a driver with him. ____________________________________________   FINAL CLINICAL IMPRESSION(S) / ED DIAGNOSES  Final diagnoses:  Chronic left-sided low back pain with left-sided sciatica      Tommi RumpsRhonda L Myria Steenbergen, PA-C 12/03/14 1619  Tommi Rumpshonda L Neil Brickell, PA-C 12/03/14 1619  Jene Everyobert Kinner, MD 12/03/14  1924

## 2015-01-23 ENCOUNTER — Emergency Department
Admission: EM | Admit: 2015-01-23 | Discharge: 2015-01-23 | Disposition: A | Discharge status: Home / Self Care | Payer: Self-pay | Attending: Emergency Medicine | Admitting: Emergency Medicine

## 2015-01-23 ENCOUNTER — Emergency Department: Payer: Self-pay

## 2015-01-23 ENCOUNTER — Encounter: Payer: Self-pay | Admitting: Emergency Medicine

## 2015-01-23 DIAGNOSIS — Z791 Long term (current) use of non-steroidal anti-inflammatories (NSAID): Secondary | ICD-10-CM | POA: Insufficient documentation

## 2015-01-23 DIAGNOSIS — J209 Acute bronchitis, unspecified: Secondary | ICD-10-CM | POA: Insufficient documentation

## 2015-01-23 DIAGNOSIS — F17210 Nicotine dependence, cigarettes, uncomplicated: Secondary | ICD-10-CM | POA: Insufficient documentation

## 2015-01-23 DIAGNOSIS — I10 Essential (primary) hypertension: Secondary | ICD-10-CM | POA: Insufficient documentation

## 2015-01-23 DIAGNOSIS — J159 Unspecified bacterial pneumonia: Secondary | ICD-10-CM | POA: Insufficient documentation

## 2015-01-23 DIAGNOSIS — J189 Pneumonia, unspecified organism: Secondary | ICD-10-CM

## 2015-01-23 MED ORDER — AZITHROMYCIN 250 MG PO TABS: ORAL_TABLET | ORAL | Status: DC

## 2015-01-23 MED ORDER — IPRATROPIUM-ALBUTEROL 0.5-2.5 (3) MG/3ML IN SOLN
3.0000 mL | Freq: Once | RESPIRATORY_TRACT | Status: AC
  Administered 2015-01-23: 3 mL via RESPIRATORY_TRACT
  Filled 2015-01-23: qty 3

## 2015-01-23 MED ORDER — ALBUTEROL SULFATE HFA 108 (90 BASE) MCG/ACT IN AERS: 2.0000 | INHALATION_SPRAY | Freq: Four times a day (QID) | RESPIRATORY_TRACT | Status: DC | PRN

## 2015-01-23 MED ORDER — BENZONATATE 100 MG PO CAPS: 100.0000 mg | ORAL_CAPSULE | Freq: Three times a day (TID) | ORAL | Status: DC | PRN

## 2015-01-23 MED ORDER — PSEUDOEPH-BROMPHEN-DM 30-2-10 MG/5ML PO SYRP
5.0000 mL | ORAL_SOLUTION | Freq: Four times a day (QID) | ORAL | Status: DC | PRN
Start: 2015-01-23 — End: 2015-04-20

## 2015-01-23 NOTE — Discharge Instructions (Signed)
Community-Acquired Pneumonia, Adult °Pneumonia is an infection of the lungs. There are different types of pneumonia. One type can develop while a person is in a hospital. A different type, called community-acquired pneumonia, develops in people who are not, or have not recently been, in the hospital or other health care facility.  °CAUSES °Pneumonia may be caused by bacteria, viruses, or funguses. Community-acquired pneumonia is often caused by Streptococcus pneumonia bacteria. These bacteria are often passed from one person to another by breathing in droplets from the cough or sneeze of an infected person. °RISK FACTORS °The condition is more likely to develop in: °· People who have chronic diseases, such as chronic obstructive pulmonary disease (COPD), asthma, congestive heart failure, cystic fibrosis, diabetes, or kidney disease. °· People who have early-stage or late-stage HIV. °· People who have sickle cell disease. °· People who have had their spleen removed (splenectomy). °· People who have poor dental hygiene. °· People who have medical conditions that increase the risk of breathing in (aspirating) secretions their own mouth and nose.   °· People who have a weakened immune system (immunocompromised). °· People who smoke. °· People who travel to areas where pneumonia-causing germs commonly exist. °· People who are around animal habitats or animals that have pneumonia-causing germs, including birds, bats, rabbits, cats, and farm animals. °SYMPTOMS °Symptoms of this condition include: °· A dry cough. °· A wet (productive) cough. °· Fever. °· Sweating. °· Chest pain, especially when breathing deeply or coughing. °· Rapid breathing or difficulty breathing. °· Shortness of breath. °· Shaking chills. °· Fatigue. °· Muscle aches. °DIAGNOSIS °Your health care provider will take a medical history and perform a physical exam. You may also have other tests, including: °· Imaging studies of your chest, including  X-rays. °· Tests to check your blood oxygen level and other blood gases. °· Other tests on blood, mucus (sputum), fluid around your lungs (pleural fluid), and urine. °If your pneumonia is severe, other tests may be done to identify the specific cause of your illness. °TREATMENT °The type of treatment that you receive depends on many factors, such as the cause of your pneumonia, the medicines you take, and other medical conditions that you have. For most adults, treatment and recovery from pneumonia may occur at home. In some cases, treatment must happen in a hospital. Treatment may include: °· Antibiotic medicines, if the pneumonia was caused by bacteria. °· Antiviral medicines, if the pneumonia was caused by a virus. °· Medicines that are given by mouth or through an IV tube. °· Oxygen. °· Respiratory therapy. °Although rare, treating severe pneumonia may include: °· Mechanical ventilation. This is done if you are not breathing well on your own and you cannot maintain a safe blood oxygen level. °· Thoracentesis. This procedure removes fluid around one lung or both lungs to help you breathe better. °HOME CARE INSTRUCTIONS °· Take over-the-counter and prescription medicines only as told by your health care provider. °¨ Only take cough medicine if you are losing sleep. Understand that cough medicine can prevent your body's natural ability to remove mucus from your lungs. °¨ If you were prescribed an antibiotic medicine, take it as told by your health care provider. Do not stop taking the antibiotic even if you start to feel better. °· Sleep in a semi-upright position at night. Try sleeping in a reclining chair, or place a few pillows under your head. °· Do not use tobacco products, including cigarettes, chewing tobacco, and e-cigarettes. If you need help quitting, ask your health care provider. °· Drink enough water to keep your urine   clear or pale yellow. This will help to thin out mucus secretions in your  lungs. PREVENTION There are ways that you can decrease your risk of developing community-acquired pneumonia. Consider getting a pneumococcal vaccine if:  You are older than 27 years of age.  You are older than 27 years of age and are undergoing cancer treatment, have chronic lung disease, or have other medical conditions that affect your immune system. Ask your health care provider if this applies to you. There are different types and schedules of pneumococcal vaccines. Ask your health care provider which vaccination option is best for you. You may also prevent community-acquired pneumonia if you take these actions:  Get an influenza vaccine every year. Ask your health care provider which type of influenza vaccine is best for you.  Go to the dentist on a regular basis.  Wash your hands often. Use hand sanitizer if soap and water are not available. SEEK MEDICAL CARE IF:  You have a fever.  You are losing sleep because you cannot control your cough with cough medicine. SEEK IMMEDIATE MEDICAL CARE IF:  You have worsening shortness of breath.  You have increased chest pain.  Your sickness becomes worse, especially if you are an older adult or have a weakened immune system.  You cough up blood.   This information is not intended to replace advice given to you by your health care provider. Make sure you discuss any questions you have with your health care provider.   Document Released: 01/14/2005 Document Revised: 10/05/2014 Document Reviewed: 05/11/2014 Elsevier Interactive Patient Education 2016 [employer] Inc.  Acute Bronchitis Bronchitis is when the airways that extend from the windpipe into the lungs get red, puffy, and painful (inflamed). Bronchitis often causes thick spit (mucus) to develop. This leads to a cough. A cough is the most common symptom of bronchitis. In acute bronchitis, the condition usually begins suddenly and goes away over time (usually in 2 weeks). Smoking,  allergies, and asthma can make bronchitis worse. Repeated episodes of bronchitis may cause more lung problems. HOME CARE  Rest.  Drink enough fluids to keep your pee (urine) clear or pale yellow (unless you need to limit fluids as told by your doctor).  Only take over-the-counter or prescription medicines as told by your doctor.  Avoid smoking and secondhand smoke. These can make bronchitis worse. If you are a smoker, think about using nicotine gum or skin patches. Quitting smoking will help your lungs heal faster.  Reduce the chance of getting bronchitis again by:  Washing your hands often.  Avoiding people with cold symptoms.  Trying not to touch your hands to your mouth, nose, or eyes.  Follow up with your doctor as told. GET HELP IF: Your symptoms do not improve after 1 week of treatment. Symptoms include:  Cough.  Fever.  Coughing up thick spit.  Body aches.  Chest congestion.  Chills.  Shortness of breath.  Sore throat. GET HELP RIGHT AWAY IF:   You have an increased fever.  You have chills.  You have severe shortness of breath.  You have bloody thick spit (sputum).  You throw up (vomit) often.  You lose too much body fluid (dehydration).  You have a severe headache.  You faint. MAKE SURE YOU:   Understand these instructions.  Will watch your condition.  Will get help right away if you are not doing well or get worse.   This information is not intended to replace advice given to you by your  health care provider. Make sure you discuss any questions you have with your health care provider.   Document Released: 07/03/2007 Document Revised: 09/16/2012 Document Reviewed: 07/07/2012 Elsevier Interactive Patient Education [employer]  Your chest x-ray shows an early, mild pneumonia. Take the prescription meds as directed. Follow-up with your provider or [facility] as needed for ongoing symptoms. Increase fluid intake to reduce symptoms.

## 2015-01-23 NOTE — ED Notes (Signed)
Cough, congestion for 3 days.

## 2015-01-23 NOTE — ED Notes (Signed)
Pt in w/ complaints of cough, congestion x 3 days; pt reports, "I think my Bronchitis has came back."  Pt in no immediate distress.

## 2015-01-23 NOTE — ED Provider Notes (Signed)
[facility] Emergency Department Provider Note ____________________________________________  Time seen: 1230  I have reviewed the triage vital signs and the nursing notes.  HISTORY  Chief Complaint  Cough and Nasal Congestion  HPI Luis Wall is a [age] y.o. male ports to the ED for evaluation of cough and congestion for the last 3-5 days. He denies any fevers, chills, sweats. He describes a nonproductive cough, some chest tightness and some intermittent shortness of breath. He has been using his wife's inhaler and nebulizers with some limited benefit., Cough drops and some prescription cough syrup. He does admit to being a one half pack per day smoker, but no serious medically decreased his smoking since the onset of his cough. He denies any other upper respiratory symptoms, recent travel, or sick contacts.He rates his overall generalized discomfort at a 10/10 in triage.  Past Medical History  Diagnosis Date  . Hypertension   . Anxiety   . Anxiety     Patient Active Problem List   Diagnosis Date Noted  . 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 Tonsillectomy    . Wisdom tooth extraction      Current Outpatient Rx  Name  Route  Sig  Dispense  Refill  . albuterol (PROVENTIL HFA;VENTOLIN HFA) 108 (90 BASE) MCG/ACT inhaler   Inhalation   Inhale 2 puffs into the lungs every 6 (six) hours as needed for wheezing or shortness of breath.   1 Inhaler   0   . azithromycin (ZITHROMAX Z-PAK) 250 MG tablet      Take 2 tablets (500 mg) on  Day 1,  followed by 1 tablet (250 mg) once daily on Days 2 through 5.   6 each   0   . benzonatate (TESSALON PERLES) 100 MG capsule   Oral   Take 1 capsule (100 mg total) by mouth 3 (three) times daily as needed for cough (Take 1-2 per dose).   30 capsule   0   . brompheniramine-pseudoephedrine-DM 30-2-10 MG/5ML syrup    Oral   Take 5 mLs by mouth 4 (four) times daily as needed.   120 mL   0   . diclofenac (VOLTAREN) 75 MG EC tablet   Oral   Take 1 tablet (75 mg total) by mouth 2 (two) times daily.   20 tablet   0   . HYDROcodone-acetaminophen (NORCO) 10-325 MG per tablet   Oral   Take 1 tablet by mouth every 6 (six) hours as needed (addiction).         Marland Kitchen. oxycodone (ROXICODONE) 30 MG immediate release tablet   Oral   Take 30 mg by mouth every 2 (two) hours as needed for pain (addiction).          Allergies Flexeril; Ibuprofen; Naproxen; Tramadol; and Vicodin  Family History  Problem Relation Age of Onset  . Hypertension Mother     Social History Social History  Substance Use Topics  . Smoking status: Current Every Day Smoker -- 0.50 packs/day    Types: Cigarettes  . Smokeless tobacco: Never Used  . Alcohol Use: Yes     Comment: once a week    Review of Systems  Constitutional: Negative for fever. Eyes: Negative for visual changes. ENT: Negative for sore throat. Cardiovascular: Negative for chest pain. Respiratory: Negative for shortness of breath. Gastrointestinal: Negative for abdominal pain, vomiting and diarrhea. Genitourinary: Negative for dysuria. Musculoskeletal: Negative for  back pain. Skin: Negative for rash. Neurological: Negative for headaches, focal weakness or numbness. ____________________________________________  PHYSICAL EXAM:  VITAL SIGNS: ED Triage Vitals  Enc Vitals Group     BP 01/23/15 1127 153/104 mmHg     Pulse Rate 01/23/15 1127 87     Resp 01/23/15 1127 20     Temp 01/23/15 1127 98.6 F (37 C)     Temp Source 01/23/15 1127 Oral     SpO2 01/23/15 1127 96 %     Weight 01/23/15 1127 260 lb (117.935 kg)     Height 01/23/15 1127  (1.854 m)     Head Cir --      Peak Flow --      Pain Score 01/23/15 1130 10     Pain Loc --      Pain Edu? --      Excl. in GC? --    Constitutional: Alert and oriented. Well appearing and in no  distress. Head: Normocephalic and atraumatic.      Eyes: Conjunctivae are normal. PERRL. Normal extraocular movements      Ears: Canals clear. TMs intact bilaterally.   Nose: No congestion/rhinorrhea.   Mouth/Throat: Mucous membranes are moist.   Neck: Supple. No thyromegaly. Hematological/Lymphatic/Immunological: No cervical lymphadenopathy. Cardiovascular: Normal rate, regular rhythm.  Respiratory: Normal respiratory effort. Mild intermittent wheezing noted bilaterally. No rales/rhonchi. Gastrointestinal: Soft and nontender. No distention. Musculoskeletal: Nontender with normal range of motion in all extremities.  Neurologic:  Normal gait without ataxia. Normal speech and language. No gross focal neurologic deficits are appreciated. Skin:  Skin is warm, dry and intact. No rash noted. Psychiatric: Mood and affect are normal. Patient exhibits appropriate insight and judgment. ____________________________________________   RADIOLOGY  CXR IMPRESSION: Minor streaky lingula atelectasis/pneumonia. ____________________________________________  PROCEDURES  DuoNeb x 1  - Patient verbalizes improvement following breathing treatment. ____________________________________________  INITIAL IMPRESSION / ASSESSMENT AND PLAN / ED COURSE  Patient with a cough and shortness of breath consistent with maybe an early pneumonia. He'll be discharged with prescriptions for azithromycin, Tessalon Perles, albuterol inhaler, and Bromfed DM. He'll follow with his primary care provider for ongoing symptoms. ____________________________________________  FINAL CLINICAL IMPRESSION(S) / ED DIAGNOSES  Final diagnoses:  Bronchitis, acute, with bronchospasm  Community acquired pneumonia      Lissa Hoard, PA-C 01/23/15 1424  Emily Filbert, MD 01/23/15 [phone]

## 2015-02-13 ENCOUNTER — Ambulatory Visit: Payer: Self-pay

## 2015-04-19 ENCOUNTER — Encounter (HOSPITAL_COMMUNITY): Payer: Self-pay | Admitting: Emergency Medicine

## 2015-04-19 ENCOUNTER — Emergency Department (HOSPITAL_COMMUNITY): Payer: Self-pay

## 2015-04-19 ENCOUNTER — Observation Stay (HOSPITAL_COMMUNITY)
Admission: EM | Admit: 2015-04-19 | Discharge: 2015-04-20 | Disposition: A | Discharge status: Home / Self Care | Payer: Self-pay | Attending: Surgery | Admitting: Surgery

## 2015-04-19 DIAGNOSIS — F17210 Nicotine dependence, cigarettes, uncomplicated: Secondary | ICD-10-CM | POA: Insufficient documentation

## 2015-04-19 DIAGNOSIS — Z79891 Long term (current) use of opiate analgesic: Secondary | ICD-10-CM | POA: Insufficient documentation

## 2015-04-19 DIAGNOSIS — I10 Essential (primary) hypertension: Secondary | ICD-10-CM | POA: Insufficient documentation

## 2015-04-19 DIAGNOSIS — Z419 Encounter for procedure for purposes other than remedying health state, unspecified: Secondary | ICD-10-CM

## 2015-04-19 DIAGNOSIS — K8020 Calculus of gallbladder without cholecystitis without obstruction: Secondary | ICD-10-CM | POA: Diagnosis present

## 2015-04-19 DIAGNOSIS — K8010 Calculus of gallbladder with chronic cholecystitis without obstruction: Principal | ICD-10-CM | POA: Insufficient documentation

## 2015-04-19 DIAGNOSIS — Z79899 Other long term (current) drug therapy: Secondary | ICD-10-CM | POA: Insufficient documentation

## 2015-04-19 LAB — URINALYSIS, ROUTINE W REFLEX MICROSCOPIC
Bilirubin Urine: NEGATIVE
GLUCOSE, UA: NEGATIVE mg/dL
Hgb urine dipstick: NEGATIVE
Ketones, ur: NEGATIVE mg/dL
LEUKOCYTES UA: NEGATIVE
Nitrite: NEGATIVE
PROTEIN: NEGATIVE mg/dL
Specific Gravity, Urine: 1.018 (ref 1.005–1.030)
pH: 7.5 (ref 5.0–8.0)

## 2015-04-19 LAB — CBC
HCT: 47.8 % (ref 39.0–52.0)
Hemoglobin: 16.2 g/dL (ref 13.0–17.0)
MCH: 28.2 pg (ref 26.0–34.0)
MCHC: 33.9 g/dL (ref 30.0–36.0)
MCV: 83.3 fL (ref 78.0–100.0)
Platelets: 216 10*3/uL (ref 150–400)
RBC: 5.74 MIL/uL (ref 4.22–5.81)
RDW: 13 % (ref 11.5–15.5)
WBC: 8.7 10*3/uL (ref 4.0–10.5)

## 2015-04-19 LAB — URINE MICROSCOPIC-ADD ON
BACTERIA UA: NONE SEEN
RBC / HPF: NONE SEEN RBC/hpf (ref 0–5)
WBC, UA: NONE SEEN WBC/hpf (ref 0–5)

## 2015-04-19 LAB — COMPREHENSIVE METABOLIC PANEL
ALT: 49 U/L (ref 17–63)
AST: 31 U/L (ref 15–41)
Albumin: 4.3 g/dL (ref 3.5–5.0)
Alkaline Phosphatase: 55 U/L (ref 38–126)
Anion gap: 9 (ref 5–15)
BUN: 15 mg/dL (ref 6–20)
CALCIUM: 9.7 mg/dL (ref 8.9–10.3)
CO2: 27 mmol/L (ref 22–32)
Chloride: 104 mmol/L (ref 101–111)
Creatinine, Ser: 1.08 mg/dL (ref 0.61–1.24)
GFR calc Af Amer: >60 mL/min (ref >60)
GFR calc non Af Amer: >60 mL/min (ref >60)
Glucose, Bld: 127 mg/dL — ABNORMAL HIGH (ref 65–99)
Potassium: 3.7 mmol/L (ref 3.5–5.1)
Sodium: 140 mmol/L (ref 135–145)
TOTAL PROTEIN: 7.4 g/dL (ref 6.5–8.1)
Total Bilirubin: 0.9 mg/dL (ref 0.3–1.2)

## 2015-04-19 LAB — SURGICAL PCR SCREEN
MRSA, PCR: NEGATIVE
STAPHYLOCOCCUS AUREUS: NEGATIVE

## 2015-04-19 LAB — RAPID URINE DRUG SCREEN, HOSP PERFORMED
Amphetamines: NOT DETECTED
BENZODIAZEPINES: NOT DETECTED
Barbiturates: NOT DETECTED
Cocaine: POSITIVE — AB
Opiates: POSITIVE — AB
Tetrahydrocannabinol: NOT DETECTED

## 2015-04-19 LAB — LIPASE, BLOOD: Lipase: 24 U/L (ref 11–51)

## 2015-04-19 MED ORDER — HEPARIN SODIUM (PORCINE) 5000 UNIT/ML IJ SOLN
5000.0000 [IU] | Freq: Three times a day (TID) | INTRAMUSCULAR | Status: DC
  Filled 2015-04-19 (×3): qty 1

## 2015-04-19 MED ORDER — NICOTINE 14 MG/24HR TD PT24
14.0000 mg | MEDICATED_PATCH | Freq: Every day | TRANSDERMAL | Status: DC
  Administered 2015-04-19: 14 mg via TRANSDERMAL
  Filled 2015-04-19: qty 1

## 2015-04-19 MED ORDER — KETOROLAC TROMETHAMINE 30 MG/ML IJ SOLN
30.0000 mg | Freq: Three times a day (TID) | INTRAMUSCULAR | Status: DC | PRN
  Administered 2015-04-19 – 2015-04-20 (×2): 30 mg via INTRAVENOUS
  Filled 2015-04-19 (×2): qty 1

## 2015-04-19 MED ORDER — NICOTINE 21 MG/24HR TD PT24
21.0000 mg | MEDICATED_PATCH | Freq: Every day | TRANSDERMAL | Status: DC
  Administered 2015-04-19: 21 mg via TRANSDERMAL
  Filled 2015-04-19 (×2): qty 1

## 2015-04-19 MED ORDER — ONDANSETRON 4 MG PO TBDP: 4.0000 mg | ORAL_TABLET | Freq: Four times a day (QID) | ORAL | Status: DC | PRN

## 2015-04-19 MED ORDER — ACETAMINOPHEN 650 MG RE SUPP: 650.0000 mg | Freq: Four times a day (QID) | RECTAL | Status: DC | PRN

## 2015-04-19 MED ORDER — KETOROLAC TROMETHAMINE 30 MG/ML IJ SOLN
15.0000 mg | Freq: Once | INTRAMUSCULAR | Status: AC
  Administered 2015-04-19: 15 mg via INTRAVENOUS
  Filled 2015-04-19: qty 1

## 2015-04-19 MED ORDER — MORPHINE SULFATE (PF) 2 MG/ML IV SOLN
2.0000 mg | INTRAVENOUS | Status: DC | PRN
  Administered 2015-04-19: 2 mg via INTRAVENOUS
  Filled 2015-04-19: qty 1

## 2015-04-19 MED ORDER — DIPHENHYDRAMINE HCL 25 MG PO CAPS: 25.0000 mg | ORAL_CAPSULE | Freq: Four times a day (QID) | ORAL | Status: DC | PRN

## 2015-04-19 MED ORDER — DEXTROSE 5 % IV SOLN
2.0000 g | INTRAVENOUS | Status: DC
  Filled 2015-04-19: qty 2

## 2015-04-19 MED ORDER — FENTANYL CITRATE (PF) 100 MCG/2ML IJ SOLN
50.0000 ug | Freq: Once | INTRAMUSCULAR | Status: AC
  Administered 2015-04-19: 50 ug via INTRAVENOUS
  Filled 2015-04-19: qty 2

## 2015-04-19 MED ORDER — OXYCODONE HCL 5 MG PO TABS
5.0000 mg | ORAL_TABLET | ORAL | Status: DC | PRN
  Administered 2015-04-19: 5 mg via ORAL
  Administered 2015-04-19 – 2015-04-20 (×3): 10 mg via ORAL
  Filled 2015-04-19: qty 2
  Filled 2015-04-19: qty 1
  Filled 2015-04-19 (×2): qty 2

## 2015-04-19 MED ORDER — ACETAMINOPHEN 325 MG PO TABS: 650.0000 mg | ORAL_TABLET | Freq: Four times a day (QID) | ORAL | Status: DC | PRN

## 2015-04-19 MED ORDER — POTASSIUM CHLORIDE IN NACL 20-0.9 MEQ/L-% IV SOLN
INTRAVENOUS | Status: DC
  Administered 2015-04-19 (×2): via INTRAVENOUS
  Filled 2015-04-19 (×3): qty 1000

## 2015-04-19 MED ORDER — LORAZEPAM 2 MG/ML IJ SOLN
1.0000 mg | Freq: Once | INTRAMUSCULAR | Status: AC
  Administered 2015-04-19: 1 mg via INTRAVENOUS
  Filled 2015-04-19: qty 1

## 2015-04-19 MED ORDER — ONDANSETRON HCL 4 MG/2ML IJ SOLN: 4.0000 mg | Freq: Four times a day (QID) | INTRAMUSCULAR | Status: DC | PRN

## 2015-04-19 MED ORDER — ENOXAPARIN SODIUM 60 MG/0.6ML ~~LOC~~ SOLN
0.5000 mg/kg | Freq: Once | SUBCUTANEOUS | Status: AC
  Administered 2015-04-19: 55 mg via SUBCUTANEOUS
  Filled 2015-04-19: qty 0.6

## 2015-04-19 MED ORDER — MORPHINE SULFATE (PF) 2 MG/ML IV SOLN
2.0000 mg | INTRAVENOUS | Status: DC | PRN
  Administered 2015-04-19 (×3): 2 mg via INTRAVENOUS
  Filled 2015-04-19 (×3): qty 1

## 2015-04-19 MED ORDER — SODIUM CHLORIDE 0.9 % IV BOLUS (SEPSIS)
1000.0000 mL | Freq: Once | INTRAVENOUS | Status: AC
  Administered 2015-04-19: 1000 mL via INTRAVENOUS

## 2015-04-19 MED ORDER — DIPHENHYDRAMINE HCL 50 MG/ML IJ SOLN: 25.0000 mg | Freq: Four times a day (QID) | INTRAMUSCULAR | Status: DC | PRN

## 2015-04-19 NOTE — ED Notes (Signed)
    [address]       [place] [address]             [phone]                    Luis Wall [facility] Medical Record #[id number] Date of Birth: 12/15/1961  Referring: Feng, Yan, MD Primary Care: Kremer, William Alfred, MD Primary Cardiologist: None  Chief Complaint:    Chief Complaint  Patient presents with   Esophageal Cancer    Surgical consult, Chest CT 10/11/21/ CTA C/A/P 10/15/21/ PET Scan 10/29/21/ Upper Endo 10/26/21/     History of Present Illness:    Luis Wall 30 y.o. male presents for surgical evaluation of stage II proximal esophageal cancer.  He recently completed his neoadjuvant chemoradiation.  His last dose of radiation was December 12, 2021.  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  He had a feeding tube placed at the time.  Unfortunately this feeding tube has come out, and he did not notify the oncologist of this in time to put it back.  He is tolerating some p.o. but is not able to eat much.       Zubrod Score: At the time of surgery this patient's most appropriate activity status/level should be described as: []    0    Normal activity, no symptoms []    1    Restricted in physical strenuous activity but ambulatory, able to do out light work [x]    2    Ambulatory and capable of self care, unable to do work activities, up and about               >50 % of waking hours                              []    3    Only limited self care, in bed greater than 50% of waking hours []    4    Completely disabled, no self care, confined to bed or chair []    5    Moribund   Past Medical History:  Diagnosis Date   Chronic kidney disease    stage 2-3-on meds   Diabetes mellitus type II    on meds   Esophageal cancer (HCC)    GERD (gastroesophageal reflux disease)    diet controlled   Hyperlipidemia    on meds   Hypertension    on meds   Seasonal allergies     Sleep apnea    uses CPAP    Past Surgical History:  Procedure Laterality Date   BIOPSY  11/01/2021   Procedure: BIOPSY;  Surgeon: Mansouraty, Gabriel Jr., MD;  Location: [facility];  Service: Gastroenterology;;   ESOPHAGOGASTRODUODENOSCOPY (EGD) WITH PROPOFOL N/A 12/06/2017   Procedure: ESOPHAGOGASTRODUODENOSCOPY (EGD) WITH PROPOFOL;  Surgeon: Cirigliano, Vito V, DO;  Location: [facility];  Service: Gastroenterology;  Laterality: N/A;   ESOPHAGOGASTRODUODENOSCOPY (EGD) WITH PROPOFOL N/A 11/01/2021   Procedure: ESOPHAGOGASTRODUODENOSCOPY (EGD) WITH PROPOFOL;  Surgeon: Mansouraty, Gabriel Jr., MD;  Location: [facility];  Service: Gastroenterology;  Laterality: N/A;   EUS N/A 11/01/2021   Procedure: UPPER ENDOSCOPIC ULTRASOUND (EUS) RADIAL;  Surgeon: Mansouraty, Gabriel Jr., MD;  Location: [facility];  Service: Gastroenterology;  Laterality: N/A;   FOREIGN BODY REMOVAL  12/06/2017   Procedure: FOREIGN BODY REMOVAL;  Surgeon:   Cirigliano, Vito V, DO;  Location: [facility];  Service: Gastroenterology;;   HEMORRHOID SURGERY     IR IMAGING GUIDED PORT INSERTION  11/16/2021   KNEE CARTILAGE SURGERY Right 2019   LAPAROTOMY N/A 10/16/2021   Procedure: EXPLORATORY LAPAROTOMY SMALL BOWEL RESECTION, PLACEMENT OF G-TUBGE;  Surgeon: Kinsinger, Luke Aaron, MD;  Location: [facility];  Service: General;  Laterality: N/A;   UPPER GASTROINTESTINAL ENDOSCOPY     VIDEO BRONCHOSCOPY N/A 11/01/2021   Procedure: VIDEO BRONCHOSCOPY WITHOUT FLUORO;  Surgeon: Dewald, Jonathan B, MD;  Location: [facility];  Service: Pulmonary;  Laterality: N/A;    Family History  Problem Relation Age of Onset   Diabetes Mother    Heart disease Mother    Hyperlipidemia Mother    Diabetes Father    Heart disease Father    Esophageal cancer Neg Hx    Colon cancer Neg Hx    Rectal cancer Neg Hx    Stomach cancer Neg Hx    Colon polyps Neg Hx      Social History   Tobacco Use  Smoking Status Never  Smokeless Tobacco  Never    Social History   Substance and Sexual Activity  Alcohol Use No     Allergies  Allergen Reactions   Levofloxacin     SEVERE RASH   Penicillins Hives and Shortness Of Breath    HIVES AND SOB   Bee Venom Swelling   Naproxen Rash   Losartan Potassium Other (See Comments)    Unknown reaction    Ace Inhibitors Hives   Keflex [Cephalexin] Hives   Latex Rash   Lisinopril Hives   Metoprolol Hives   Omeprazole Hives    Current Outpatient Medications  Medication Sig Dispense Refill   acetaminophen (TYLENOL) 500 MG tablet Place 500 mg into feeding tube every 6 (six) hours as needed for moderate pain or headache.     amLODipine (NORVASC) 10 MG tablet TAKE ONE TABLET BY MOUTH ONE TIME DAILY (Patient taking differently: Place 10 mg into feeding tube daily.) 90 tablet 3   blood glucose meter kit and supplies KIT Test blood sugar daily. Dx code: E11.9 1 each 0   carvedilol (COREG) 6.25 MG tablet Take 1 tablet (6.25 mg total) by mouth 2 (two) times daily with a meal. (Patient taking differently: Place 6.25 mg into feeding tube 2 (two) times daily with a meal.) 30 tablet 2   Continuous Blood Gluc Receiver (FREESTYLE LIBRE 14 DAY READER) DEVI Scan as needed.     Continuous Blood Gluc Sensor (FREESTYLE LIBRE 14 DAY SENSOR) MISC APPLY ONE SENSOR TO THE BACK OF YOUR UPPER ARM. REPLACE EVERY 14 DAYS. (Patient taking differently: Inject 1 Device into the skin See admin instructions. APPLY ONE SENSOR TO THE BACK OF THE UPPER ARM- REPLACE EVERY 14 DAYS) 6 each 1   EPINEPHrine 0.3 mg/0.3 mL IJ SOAJ injection Inject 0.3 mg into the muscle as needed for anaphylaxis. 1 each 2   eszopiclone (LUNESTA) 2 MG TABS tablet Take 1 tablet (2 mg total) by mouth at bedtime as needed for sleep. Take immediately before bedtime 30 tablet 1   glimepiride (AMARYL) 1 MG tablet Take 1 tablet (1 mg total) by mouth daily with breakfast. (Patient taking differently: Place 1 mg into feeding tube daily with breakfast.) 30  tablet 11   HYDROmorphone (DILAUDID) 2 MG tablet Take 0.5 tablets (1 mg total) by mouth every 3 (three) hours as needed for moderate pain. (Patient taking differently: Place 1 mg into   feeding tube every 3 (three) hours as needed for moderate pain.) 30 tablet 0   Lancets MISC Test blood sugar daily. Dx code: E11.9 100 each 3   lansoprazole (PREVACID) 30 MG capsule Take 1 capsule (30 mg total) by mouth daily at 12 noon. (Patient taking differently: Place 30 mg into feeding tube daily at 12 noon.) 90 capsule 5   loperamide (IMODIUM) 2 MG capsule Take 1 capsule (2 mg total) by mouth daily. (Patient taking differently: 2 mg daily. Per feeding tube) 30 capsule 0   magnesium oxide (MAG-OX) 400 (240 Mg) MG tablet Take 1 tablet (400 mg total) by mouth daily. 30 tablet 0   Nutritional Supplements (FEEDING SUPPLEMENT, OSMOLITE 1.5 CAL,) LIQD Place 474 mLs into feeding tube 2 (two) times daily. Also continue Mix 1 Boost Breeze (237mL) with 120mL water and provide via g tube 3 Times Daily 4740 mL 0   ondansetron (ZOFRAN) 8 MG tablet Take 1 tablet (8 mg total) by mouth every 8 (eight) hours as needed for nausea or vomiting. Start on the third day after chemotherapy. 30 tablet 1   prochlorperazine (COMPAZINE) 10 MG tablet Take 1 tablet (10 mg total) by mouth every 6 (six) hours as needed. (Patient taking differently: Place 10 mg into feeding tube every 6 (six) hours as needed for nausea or vomiting.) 30 tablet 2   sucralfate (CARAFATE) 1 g tablet Take 1 tablet (1 g total) by mouth 4 (four) times daily. Dissolve each tablet in 15 cc water before use. 120 tablet 2   triamcinolone (KENALOG) 0.1 % APPLY TOPICALLY TO AFFECTED AREA(S) TWO TIMES A DAY (Patient taking differently: Apply 1 Application topically 2 (two) times daily as needed (dry skin).) 80 g 1   UNABLE TO FIND Give 1 tablet by tube every other day. Med Name: Zeltassa     VELTASSA 8.4 g packet SMARTSIG:1 Packet(s) By Mouth Every 3 Days     Water For Irrigation,  Sterile (FREE WATER) SOLN Place 30 mLs into feeding tube 5 (five) times daily. 300 mL 0   No current facility-administered medications for this visit.    Review of Systems  Constitutional:  Positive for malaise/fatigue and weight loss.  Respiratory:  Negative for shortness of breath.   Cardiovascular:  Negative for chest pain.  Neurological: Negative.      PHYSICAL EXAMINATION: BP (!) 170/82 (BP Location: Right Arm, Patient Position: Sitting)   Pulse 60   Resp 18   Ht 5' 5" (1.651 m)   Wt 155 lb (70.3 kg)   SpO2 100% Comment: RA  BMI 25.79 kg/m  Physical Exam Constitutional:      General: He is not in acute distress.    Appearance: He is ill-appearing. He is not toxic-appearing or diaphoretic.  Cardiovascular:     Rate and Rhythm: Normal rate.  Pulmonary:     Effort: Pulmonary effort is normal. No respiratory distress.  Abdominal:     General: Abdomen is flat. There is no distension.    Musculoskeletal:        General: Normal range of motion.     Cervical back: Normal range of motion.  Skin:    General: Skin is warm and dry.  Neurological:     General: No focal deficit present.     Mental Status: He is alert and oriented to person, place, and time.         I have independently reviewed the above radiology studies  and reviewed the findings with the patient.     Recent Lab Findings: Lab Results  Component Value Date   WBC 2.1 (L) 12/10/2021   HGB 9.3 (L) 12/10/2021   HCT 28.1 (L) 12/10/2021   PLT 197 12/10/2021   GLUCOSE 135 (H) 12/10/2021   CHOL 162 04/26/2021   TRIG 64.0 04/26/2021   HDL 52.40 04/26/2021   LDLDIRECT 92.0 01/25/2021   LDLCALC 97 04/26/2021   ALT 6 12/10/2021   AST 11 (L) 12/10/2021   NA 140 12/10/2021   K 4.5 12/10/2021   CL 111 12/10/2021   CREATININE 2.68 (H) 12/10/2021   BUN 38 (H) 12/10/2021   CO2 24 12/10/2021   TSH 0.47 07/26/2021   INR 0.9 11/07/2020   HGBA1C 6.7 (H) 09/27/2021      Assessment / Plan:   30-year-old  male with 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  It was originally identified at 23 mm from the incisors.  He has completed his neoadjuvant chemotherapy, and his last dose of radiation was December 12, 2021.  On review of his PET/CT there is avidity spanning up to the level of the clavicles which is concerning.  Additionally he has had significant weight loss and recently lost his jejunostomy tube but did not notify anyone.  I will reach out to Dr. Feng in regards to getting this replaced preferably percutaneously.  I do think that he is malnourished and likely would not tolerate esophagectomy.  Additionally, given the proximity of his disease on pretreatment PET/CT I am concerned that it would be difficult to obtain negative margins even with a cervical anastomosis.  I have given him a referral to [facility] for second opinion.       I  spent 40 minutes with the patient face to face counseling and coordination of care.    Harrell O Lightfoot 12/15/2021 5:26 PM       11/15 

## 2015-04-19 NOTE — Anesthesia Preprocedure Evaluation (Addendum)
Anesthesia Evaluation  Patient identified by MRN, date of birth, ID band Patient awake    Reviewed: Allergy & Precautions, NPO status , Patient's Chart, lab work & pertinent test results  Airway Mallampati: III       Dental  (+) Teeth Intact, Dental Advisory Given, Missing,    Pulmonary neg pulmonary ROS, Current Smoker,    breath sounds clear to auscultation       Cardiovascular hypertension, negative cardio ROS   Rhythm:Regular     Neuro/Psych Anxiety negative neurological ROS  negative psych ROS   GI/Hepatic negative GI ROS, Neg liver ROS, (+)     substance abuse  ,   Endo/Other  negative endocrine ROS  Renal/GU negative Renal ROS  negative genitourinary   Musculoskeletal negative musculoskeletal ROS (+)   Abdominal   Peds negative pediatric ROS (+)  Hematology negative hematology ROS (+)   Anesthesia Other Findings   Reproductive/Obstetrics negative OB ROS                           Anesthesia Physical Anesthesia Plan  ASA: II  Anesthesia Plan: General   Post-op Pain Management:    Induction: Intravenous  Airway Management Planned: Oral ETT  Additional Equipment:   Intra-op Plan:   Post-operative Plan: Extubation in OR  Informed Consent: I have reviewed the patients History and Physical, chart, labs and discussed the procedure including the risks, benefits and alternatives for the proposed anesthesia with the patient or authorized representative who has indicated his/her understanding and acceptance.     Plan Discussed with:   Anesthesia Plan Comments:         Anesthesia Quick Evaluation

## 2015-04-19 NOTE — ED Notes (Signed)
Pt stated someone ambulated him to restroom and didn't notify him that he needed a urine sample. Pt is aware we need one and will let us know when he things he can void again.

## 2015-04-19 NOTE — ED Notes (Signed)
Per pt, states abdominal pain that radiates to his back-woke him out of sleep last night

## 2015-04-19 NOTE — Progress Notes (Signed)
ANTICOAGULATION CONSULT NOTE - Initial Consult  Pharmacy Consult for Lovenox Indication: VTE prophylaxis  Allergies  Allergen Reactions  . Flexeril [Cyclobenzaprine] Other (See Comments)    nightmares  . Ibuprofen Nausea Only  . Naproxen Nausea And Vomiting  . Tramadol Itching and Nausea Only  . Vicodin [Hydrocodone-Acetaminophen] Nausea Only    Pt reports "Oxycodone is the only pain med that doesn't make me sick"    Patient Measurements: Height: 6\' 1"  (185.4 cm) Weight: 244 lb (110.678 kg) IBW/kg (Calculated) : 79.9   Vital Signs: Temp: 98.1 F (36.7 C) (03/22 0723) Temp Source: Oral (03/22 0723) BP: 151/95 mmHg (03/22 0723) Pulse Rate: 77 (03/22 0723)  Labs:  Recent Labs  04/19/15 0812  HGB 16.2  HCT 47.8  PLT 216  CREATININE 1.08    Estimated Creatinine Clearance: 134 mL/min (by C-G formula based on Cr of 1.08).   Medical History: Past Medical History  Diagnosis Date  . Hypertension   . Anxiety   . Anxiety     Medications:  Scheduled:  . enoxaparin (LOVENOX) injection  0.5 mg/kg Subcutaneous Once    Assessment: 30 yo obese male admitted for cholelithiasis without cholecystitis.  Scheduled for OR tomorrow at 9am.  Pharmacy consulted to dose Lovenox for VTE ppx.  CBC, Renal function normal  Goal of Therapy:  Prevent VTE Monitor platelets by anticoagulation protocol: Yes   Plan:   Lovenox 55 mg (0.5 mg/kg) SQ x 1 now  F/u with [facility] after procedure for appropriate timing of Lovenox restart.   Bernadene Personrew Kannon Baum, PharmD, BCPS Pager: [phone] 04/19/2015, 12:07 PM

## 2015-04-19 NOTE — Progress Notes (Signed)
Patient complaining of anxiety and nicotine craving. MD paged, order received to increase dosage from 14mg  to 21mg  for the nicotine patch. Will continue to monitor patient for anxiety.

## 2015-04-19 NOTE — ED Provider Notes (Signed)
CSN: [id number]     Arrival date & time 04/19/15  0706 History   First MD Initiated Contact with Patient 04/19/15 0730     Chief Complaint  Patient presents with  . Abdominal Pain     (Consider location/radiation/quality/duration/timing/severity/associated sxs/prior Treatment) HPI Patient presents with concern of abdominal pain, nausea, vomiting. Symptoms began less than 12 hours ago, awakening the patient from sleep. Since onset has been pain focally in the right upper quadrant of the abdomen, right flank. Pain is sore, severe, with intermittent exacerbations. There is associated nausea, the patient has had multiple episodes of vomiting. No diarrhea or bowel movement changes. No relief with attempts at medication use. No fever, chills. Patient was well prior to the onset of symptoms. He denies history of abdominal surgery.  Past Medical History  Diagnosis Date  . Hypertension   . Anxiety   . Anxiety    Past Surgical History  Procedure Laterality Date  . Tonsillectomy    . Wisdom tooth extraction     Family History  Problem Relation Age of Onset  . Hypertension Mother    Social History  Substance Use Topics  . Smoking status: Current Every Day Smoker -- 0.50 packs/day    Types: Cigarettes  . Smokeless tobacco: Never Used  . Alcohol Use: Yes     Comment: once a week     Review of Systems  Constitutional:       Per HPI, otherwise negative  HENT:       Per HPI, otherwise negative  Respiratory:       Per HPI, otherwise negative  Cardiovascular:       Per HPI, otherwise negative  Gastrointestinal: Positive for nausea and vomiting.  Endocrine:       Negative aside from HPI  Genitourinary:       Neg aside from HPI   Musculoskeletal:       Per HPI, otherwise negative  Skin: Negative.   Neurological: Negative for syncope.      Allergies  Flexeril; Ibuprofen; Naproxen; Tramadol; and Vicodin  Home Medications   Prior to Admission medications   Medication  Sig Start Date End Date Taking? Authorizing Provider  albuterol (PROVENTIL HFA;VENTOLIN HFA) 108 (90 BASE) MCG/ACT inhaler Inhale 2 puffs into the lungs every 6 (six) hours as needed for wheezing or shortness of breath. 01/23/15   Jenise V Bacon Menshew, PA-C  azithromycin (ZITHROMAX Z-PAK) 250 MG tablet Take 2 tablets (500 mg) on  Day 1,  followed by 1 tablet (250 mg) once daily on Days 2 through 5. 01/23/15   Jenise V Bacon Menshew, PA-C  benzonatate (TESSALON PERLES) 100 MG capsule Take 1 capsule (100 mg total) by mouth 3 (three) times daily as needed for cough (Take 1-2 per dose). 01/23/15   Jenise V Bacon Menshew, PA-C  brompheniramine-pseudoephedrine-DM 30-2-10 MG/5ML syrup Take 5 mLs by mouth 4 (four) times daily as needed. 01/23/15   Jenise V Bacon Menshew, PA-C  diclofenac (VOLTAREN) 75 MG EC tablet Take 1 tablet (75 mg total) by mouth 2 (two) times daily. 12/03/14   Tommi Rumps, PA-C  HYDROcodone-acetaminophen (NORCO) 10-325 MG per tablet Take 1 tablet by mouth every 6 (six) hours as needed (addiction).    Historical Provider, MD  oxycodone (ROXICODONE) 30 MG immediate release tablet Take 30 mg by mouth every 2 (two) hours as needed for pain (addiction).    Historical Provider, MD   BP 151/95 mmHg  Pulse 77  Temp(Src) 98.1 F (36.7 C) (  Oral)  Resp 16  SpO2 100% Physical Exam  Constitutional: He is oriented to person, place, and time. He appears well-developed. No distress.  HENT:  Head: Normocephalic and atraumatic.  Eyes: Conjunctivae and EOM are normal.  Cardiovascular: Normal rate and regular rhythm.   Pulmonary/Chest: Effort normal. No stridor. No respiratory distress.  Abdominal: He exhibits no distension. There is tenderness in the right upper quadrant. There is guarding. There is no rigidity.  Musculoskeletal: He exhibits no edema.  Neurological: He is alert and oriented to person, place, and time.  Skin: Skin is warm and dry.  Psychiatric: He has a normal mood and  affect.  Nursing note and vitals reviewed.   ED Course  Procedures (including critical care time) Labs Review Labs Reviewed  COMPREHENSIVE METABOLIC PANEL - Abnormal; Notable for the following:    Glucose, Bld 127 (*)    All other components within normal limits  LIPASE, BLOOD  CBC  URINALYSIS, ROUTINE W REFLEX MICROSCOPIC (NOT AT [facility])    Imaging Review [place] Abdomen Complete  04/19/2015  CLINICAL DATA:  Acute right upper quadrant pain EXAM: ABDOMEN ULTRASOUND COMPLETE COMPARISON:  Lumbar spine films of 03/20/2014 FINDINGS: Gallbladder: The gallbladder is visualized. There is a large gallstone which appears to be lodged in the neck of the gallbladder measuring 2.3 cm. Currently there is no evidence of gallbladder wall thickening or pain over the gallbladder with compression, but developing acute cholecystitis in view of the apparent lodged gallstone would be a definite consideration. Common bile duct: Diameter: The common bile duct is normal measuring 4.1 mm. Liver: The liver has a normal echogenic pattern with the exception of a echogenic focus within the mid right lobe of 2.5 x 2.4 x 2.4 cm. Most likely this represents and incidental hemangioma and and is somewhat inhomogeneous. If further assessment is warranted CT or MRI of the abdomen would be recommended. IVC: No abnormality visualized. Pancreas: Portions of the head and tail of the pancreas are obscured by bowel gas. The pancreatic duct is not appear to be dilated in the mid pancreas is unremarkable. Spleen: The spleen is normal measuring 8.4 cm. Right Kidney: Length: 10.7 cm.  No hydronephrosis is seen. Left Kidney: Length: 11.8 cm.  No hydronephrosis is noted. Abdominal aorta: The abdominal aorta is normal in caliber. Other findings:  None. IMPRESSION: 1. 2.3 cm gallstone appears to be lodged in the neck of the gallbladder. No present ultrasound evidence of acute cholecystitis is seen however but clinical correlation is recommended. 2.  Probable 2.5 cm hemangioma in the right lobe of liver which is only slightly atypical. Consider CT or MRI of the abdomen to assess further. 3. Much of the pancreas is obscured by bowel gas. Electronically Signed   By: Dwyane DeePaul  Barry M.D.   On: 04/19/2015 09:04   I have personally reviewed and evaluated these images and lab results as part of my medical decision-making.    10:08 AM I have discussed patient's case with our surgical colleagues. Patient remains in pain Patient does have history of narcotic use, treatment.  However, with persistent pain, and presence of gallbladder pathology, patient will receive medication here.  MDM  Patient presents with new right upper quadrant abdominal pain, nausea, vomiting. On exam the patient has a positive Murphy's sign. Ultrasound notable for 2.3 cm gallstone lodged in the neck of the gallbladder. Patient is afebrile, and labs are reassuring, but there is suspicion for early cholecystitis.  Given these findings I discussed this case with our  surgical team for admission. Patient was started on antibiotics, continued fluids, antiemetic, analgesia, admitted for further evaluation and management.  Gerhard Munch, MD 04/19/15 1123

## 2015-04-19 NOTE — H&P (Signed)
Luis Wall is an 30 y.o. male.   Chief Complaint: Abdominal pain HPI: Pt awoke 12:00 AM with abdominal pain going to his back.  He has had this a couple times before.  It last 2-5 hours.  He had nausea and vomiting early this AM with the pain.  No fever.  He says symptoms come on with lying down, and at night.  Work up shows he is afebrile, BP is up.  Labs are normal.  US shows:  2.3 cm gallstone appears to be lodged in the neck of the gallbladder. No present ultrasound evidence of acute cholecystitis is seen however but clinical correlation is recommended.   Probable 2.5 cm hemangioma in the right lobe of liver which is only slightly atypical.     Past Medical History  Diagnosis Date  . Hypertension   . Drug additction/detox 08/31/14, 03/25/14 evaluated but never admitted    Tobacco use/ETOH use   . Anxiety/depression      Past Surgical History  Procedure Laterality Date  . Tonsillectomy    . Wisdom tooth extraction      Family History  Problem Relation Age of Onset  . Hypertension Mother    Social History:  reports that he has been smoking Cigarettes.  He has been smoking about 0.50 packs per day. He has never used smokeless tobacco. He reports that he drinks alcohol. He reports that he does not use illicit drugs. Tobacco:  .5 PPD ETOH:  6 PACK PER WEEK DRUGS:  DENIES Unemployed, Married, 1 child   Allergies:  Allergies  Allergen Reactions  . Flexeril [Cyclobenzaprine] Other (See Comments)    nightmares  . Ibuprofen Nausea Only  . Naproxen Nausea And Vomiting  . Tramadol Itching and Nausea Only  . Vicodin [Hydrocodone-Acetaminophen] Nausea Only    Pt reports "Oxycodone is the only pain med that doesn't make me sick"     (Not in a hospital admission)  Results for orders placed or performed during the hospital encounter of 04/19/15 (from the past 48 hour(s))  Lipase, blood     Status: None   Collection Time: 04/19/15  8:12 AM  Result Value Ref Range   Lipase 24 11 -  51 U/L  Comprehensive metabolic panel     Status: Abnormal   Collection Time: 04/19/15  8:12 AM  Result Value Ref Range   Sodium 140 135 - 145 mmol/L   Potassium 3.7 3.5 - 5.1 mmol/L   Chloride 104 101 - 111 mmol/L   CO2 27 22 - 32 mmol/L   Glucose, Bld 127 (H) 65 - 99 mg/dL   BUN 15 6 - 20 mg/dL   Creatinine, Ser 1.08 0.61 - 1.24 mg/dL   Calcium 9.7 8.9 - 10.3 mg/dL   Total Protein 7.4 6.5 - 8.1 g/dL   Albumin 4.3 3.5 - 5.0 g/dL   AST 31 15 - 41 U/L   ALT 49 17 - 63 U/L   Alkaline Phosphatase 55 38 - 126 U/L   Total Bilirubin 0.9 0.3 - 1.2 mg/dL   GFR calc non Af Amer >60 >60 mL/min   GFR calc Af Amer >60 >60 mL/min    Comment: (NOTE) The eGFR has been calculated using the CKD EPI equation. This calculation has not been validated in all clinical situations. eGFR's persistently <60 mL/min signify possible Chronic Kidney Disease.    Anion gap 9 5 - 15  CBC     Status: None   Collection Time: 04/19/15  8:12 AM  Result Value Ref Range   WBC 8.7 4.0 - 10.5 K/uL   RBC 5.74 4.22 - 5.81 MIL/uL   Hemoglobin 16.2 13.0 - 17.0 g/dL   HCT 47.8 39.0 - 52.0 %   MCV 83.3 78.0 - 100.0 fL   MCH 28.2 26.0 - 34.0 pg   MCHC 33.9 30.0 - 36.0 g/dL   RDW 13.0 11.5 - 15.5 %   Platelets 216 150 - 400 K/uL   US Abdomen Complete  04/19/2015  CLINICAL DATA:  Acute right upper quadrant pain EXAM: ABDOMEN ULTRASOUND COMPLETE COMPARISON:  Lumbar spine films of 03/20/2014 FINDINGS: Gallbladder: The gallbladder is visualized. There is a large gallstone which appears to be lodged in the neck of the gallbladder measuring 2.3 cm. Currently there is no evidence of gallbladder wall thickening or pain over the gallbladder with compression, but developing acute cholecystitis in view of the apparent lodged gallstone would be a definite consideration. Common bile duct: Diameter: The common bile duct is normal measuring 4.1 mm. Liver: The liver has a normal echogenic pattern with the exception of a echogenic focus  within the mid right lobe of 2.5 x 2.4 x 2.4 cm. Most likely this represents and incidental hemangioma and and is somewhat inhomogeneous. If further assessment is warranted CT or MRI of the abdomen would be recommended. IVC: No abnormality visualized. Pancreas: Portions of the head and tail of the pancreas are obscured by bowel gas. The pancreatic duct is not appear to be dilated in the mid pancreas is unremarkable. Spleen: The spleen is normal measuring 8.4 cm. Right Kidney: Length: 10.7 cm.  No hydronephrosis is seen. Left Kidney: Length: 11.8 cm.  No hydronephrosis is noted. Abdominal aorta: The abdominal aorta is normal in caliber. Other findings:  None. IMPRESSION: 1. 2.3 cm gallstone appears to be lodged in the neck of the gallbladder. No present ultrasound evidence of acute cholecystitis is seen however but clinical correlation is recommended. 2. Probable 2.5 cm hemangioma in the right lobe of liver which is only slightly atypical. Consider CT or MRI of the abdomen to assess further. 3. Much of the pancreas is obscured by bowel gas. Electronically Signed   By: Ivar Drape M.D.   On: 04/19/2015 09:04    Review of Systems  Constitutional: Negative.   HENT: Negative.   Eyes: Negative.   Respiratory: Negative.   Cardiovascular: Negative.   Gastrointestinal: Positive for heartburn (occasional), nausea, vomiting and abdominal pain. Negative for diarrhea, constipation, blood in stool and melena.  Genitourinary: Negative.   Musculoskeletal: Negative.   Skin: Negative.   Neurological: Negative.   Endo/Heme/Allergies: Negative.   Psychiatric/Behavioral: Positive for depression. The patient is nervous/anxious.        Hx of drug use and drug seeking behavior.    Blood pressure 151/95, pulse 77, temperature 98.1 F (36.7 C), temperature source Oral, resp. rate 16, SpO2 100 %. Physical Exam  Constitutional: He is oriented to person, place, and time. He appears well-developed and well-nourished. No  distress.  HENT:  Head: Normocephalic and atraumatic.  Nose: Nose normal.  Mouth/Throat: No oropharyngeal exudate.  Eyes: Right eye exhibits no discharge. Left eye exhibits no discharge. No scleral icterus.  Neck: Normal range of motion. Neck supple. No JVD present. No tracheal deviation present. No thyromegaly present.  Cardiovascular: Normal rate, regular rhythm, normal heart sounds and intact distal pulses.   No murmur heard. Respiratory: Effort normal and breath sounds normal. No respiratory distress. He has no wheezes. He has no rales. He  exhibits no tenderness.  GI: Soft. Bowel sounds are normal. He exhibits no distension (right upper quadrant) and no mass. There is tenderness. There is no rebound and no guarding.  Musculoskeletal: He exhibits no edema or tenderness.  Lymphadenopathy:    He has no cervical adenopathy.  Neurological: He is alert and oriented to person, place, and time. No cranial nerve deficit.  Skin: Skin is warm and dry. No rash noted. He is not diaphoretic. No erythema. No pallor.  Psychiatric: He has a normal mood and affect. His behavior is normal. Judgment and thought content normal.     Assessment/Plan RUQ pain with 2.3 cm impacted gallstone Hx of Drug use  (drug screen positive for cocaine and opiates) Tobacco use/Etoh use Hx of anxiety and depression.   Plan:  Admit and plan on surgery tomorrow.  Pt denied any further drug use, but drug screen as noted above.  Asking for more pain meds.  i told staff to use PO pain med and renewed Toradol.  Sissy Goetzke, PA-C 04/19/2015, 9:54 AM

## 2015-04-20 ENCOUNTER — Encounter (HOSPITAL_COMMUNITY)
Admission: EM | Disposition: A | Discharge status: Home / Self Care | Payer: Self-pay | Source: Home / Self Care | Attending: Emergency Medicine

## 2015-04-20 ENCOUNTER — Encounter (HOSPITAL_COMMUNITY): Payer: Self-pay | Admitting: Surgery

## 2015-04-20 ENCOUNTER — Inpatient Hospital Stay (HOSPITAL_COMMUNITY): Payer: Self-pay | Admitting: Anesthesiology

## 2015-04-20 ENCOUNTER — Inpatient Hospital Stay (HOSPITAL_COMMUNITY): Payer: MEDICAID

## 2015-04-20 ENCOUNTER — Inpatient Hospital Stay (HOSPITAL_COMMUNITY): Payer: MEDICAID | Admitting: Anesthesiology

## 2015-04-20 DIAGNOSIS — K8010 Calculus of gallbladder with chronic cholecystitis without obstruction: Secondary | ICD-10-CM | POA: Diagnosis present

## 2015-04-20 HISTORY — PX: CHOLECYSTECTOMY: SHX55

## 2015-04-20 LAB — COMPREHENSIVE METABOLIC PANEL WITH GFR
ALT: 37 U/L (ref 17–63)
AST: 21 U/L (ref 15–41)
Albumin: 3.6 g/dL (ref 3.5–5.0)
Alkaline Phosphatase: 43 U/L (ref 38–126)
Anion gap: 7 (ref 5–15)
BUN: 10 mg/dL (ref 6–20)
CO2: 28 mmol/L (ref 22–32)
Calcium: 8.7 mg/dL — ABNORMAL LOW (ref 8.9–10.3)
Chloride: 104 mmol/L (ref 101–111)
Creatinine, Ser: 1.02 mg/dL (ref 0.61–1.24)
GFR calc Af Amer: >60 mL/min
GFR calc non Af Amer: >60 mL/min
Glucose, Bld: 90 mg/dL (ref 65–99)
Potassium: 3.7 mmol/L (ref 3.5–5.1)
Sodium: 139 mmol/L (ref 135–145)
Total Bilirubin: 0.5 mg/dL (ref 0.3–1.2)
Total Protein: 6.4 g/dL — ABNORMAL LOW (ref 6.5–8.1)

## 2015-04-20 LAB — CBC
HCT: 42.8 % (ref 39.0–52.0)
Hemoglobin: 14.5 g/dL (ref 13.0–17.0)
MCH: 27.8 pg (ref 26.0–34.0)
MCHC: 33.9 g/dL (ref 30.0–36.0)
MCV: 82.1 fL (ref 78.0–100.0)
Platelets: 194 K/uL (ref 150–400)
RBC: 5.21 MIL/uL (ref 4.22–5.81)
RDW: 13.1 % (ref 11.5–15.5)
WBC: 7.5 K/uL (ref 4.0–10.5)

## 2015-04-20 LAB — APTT: aPTT: 29 seconds (ref 24–37)

## 2015-04-20 LAB — PROTIME-INR
INR: 1.09 (ref 0.00–1.49)
Prothrombin Time: 13.9 s (ref 11.6–15.2)

## 2015-04-20 LAB — LIPASE, BLOOD: Lipase: 21 U/L (ref 11–51)

## 2015-04-20 SURGERY — LAPAROSCOPIC CHOLECYSTECTOMY WITH INTRAOPERATIVE CHOLANGIOGRAM
Anesthesia: General | Site: Abdomen

## 2015-04-20 MED ORDER — ONDANSETRON 4 MG PO TBDP: 4.0000 mg | ORAL_TABLET | Freq: Four times a day (QID) | ORAL | Status: DC | PRN

## 2015-04-20 MED ORDER — 0.9 % SODIUM CHLORIDE (POUR BTL) OPTIME
TOPICAL | Status: DC | PRN
  Administered 2015-04-20: 1000 mL

## 2015-04-20 MED ORDER — ONDANSETRON HCL 4 MG/2ML IJ SOLN: 4.0000 mg | Freq: Four times a day (QID) | INTRAMUSCULAR | Status: DC | PRN

## 2015-04-20 MED ORDER — GLYCOPYRROLATE 0.2 MG/ML IJ SOLN
INTRAMUSCULAR | Status: AC
  Filled 2015-04-20: qty 3

## 2015-04-20 MED ORDER — FENTANYL CITRATE (PF) 100 MCG/2ML IJ SOLN
INTRAMUSCULAR | Status: AC
  Filled 2015-04-20: qty 2

## 2015-04-20 MED ORDER — ROCURONIUM BROMIDE 100 MG/10ML IV SOLN
INTRAVENOUS | Status: DC | PRN
  Administered 2015-04-20: 40 mg via INTRAVENOUS

## 2015-04-20 MED ORDER — MEPERIDINE HCL 50 MG/ML IJ SOLN: 6.2500 mg | INTRAMUSCULAR | Status: DC | PRN

## 2015-04-20 MED ORDER — ONDANSETRON HCL 4 MG/2ML IJ SOLN
INTRAMUSCULAR | Status: DC | PRN
  Administered 2015-04-20: 4 mg via INTRAVENOUS

## 2015-04-20 MED ORDER — FENTANYL CITRATE (PF) 250 MCG/5ML IJ SOLN
INTRAMUSCULAR | Status: AC
  Filled 2015-04-20: qty 5

## 2015-04-20 MED ORDER — OXYCODONE HCL 5 MG PO TABS
5.0000 mg | ORAL_TABLET | ORAL | Status: DC | PRN
  Administered 2015-04-20: 10 mg via ORAL
  Filled 2015-04-20: qty 2

## 2015-04-20 MED ORDER — PROPOFOL 10 MG/ML IV BOLUS
INTRAVENOUS | Status: AC
  Filled 2015-04-20: qty 20

## 2015-04-20 MED ORDER — BUPIVACAINE-EPINEPHRINE 0.25% -1:200000 IJ SOLN
INTRAMUSCULAR | Status: AC
  Filled 2015-04-20: qty 1

## 2015-04-20 MED ORDER — DEXTROSE 5 % IV SOLN
2.0000 g | INTRAVENOUS | Status: DC | PRN
  Administered 2015-04-20: 2 g via INTRAVENOUS

## 2015-04-20 MED ORDER — MIDAZOLAM HCL 5 MG/5ML IJ SOLN
INTRAMUSCULAR | Status: DC | PRN
  Administered 2015-04-20: 2 mg via INTRAVENOUS

## 2015-04-20 MED ORDER — HYDROMORPHONE HCL 1 MG/ML IJ SOLN
INTRAMUSCULAR | Status: DC
Start: 2015-04-20 — End: 2015-04-20
  Filled 2015-04-20: qty 1

## 2015-04-20 MED ORDER — SUCCINYLCHOLINE CHLORIDE 20 MG/ML IJ SOLN
INTRAMUSCULAR | Status: DC | PRN
  Administered 2015-04-20: 140 mg via INTRAVENOUS

## 2015-04-20 MED ORDER — MIDAZOLAM HCL 2 MG/2ML IJ SOLN
INTRAMUSCULAR | Status: AC
  Filled 2015-04-20: qty 2

## 2015-04-20 MED ORDER — LIDOCAINE HCL (CARDIAC) 20 MG/ML IV SOLN
INTRAVENOUS | Status: DC | PRN
  Administered 2015-04-20: 100 mg via INTRATRACHEAL

## 2015-04-20 MED ORDER — HYDROMORPHONE HCL 1 MG/ML IJ SOLN
1.0000 mg | INTRAMUSCULAR | Status: DC | PRN
  Administered 2015-04-20 (×3): 1 mg via INTRAVENOUS
  Filled 2015-04-20 (×3): qty 1

## 2015-04-20 MED ORDER — ONDANSETRON HCL 4 MG/2ML IJ SOLN
INTRAMUSCULAR | Status: AC
  Filled 2015-04-20: qty 2

## 2015-04-20 MED ORDER — SUGAMMADEX SODIUM 500 MG/5ML IV SOLN
INTRAVENOUS | Status: AC
  Filled 2015-04-20: qty 5

## 2015-04-20 MED ORDER — PROMETHAZINE HCL 25 MG/ML IJ SOLN: 6.2500 mg | INTRAMUSCULAR | Status: DC | PRN

## 2015-04-20 MED ORDER — FENTANYL CITRATE (PF) 100 MCG/2ML IJ SOLN
INTRAMUSCULAR | Status: DC
Start: 2015-04-20 — End: 2015-04-20
  Filled 2015-04-20: qty 2

## 2015-04-20 MED ORDER — FENTANYL CITRATE (PF) 100 MCG/2ML IJ SOLN
25.0000 ug | INTRAMUSCULAR | Status: DC | PRN
  Administered 2015-04-20 (×2): 50 ug via INTRAVENOUS

## 2015-04-20 MED ORDER — ACETAMINOPHEN 325 MG PO TABS: 650.0000 mg | ORAL_TABLET | Freq: Four times a day (QID) | ORAL | Status: DC | PRN

## 2015-04-20 MED ORDER — KCL IN DEXTROSE-NACL 20-5-0.45 MEQ/L-%-% IV SOLN
INTRAVENOUS | Status: DC
  Administered 2015-04-20: 12:00:00 via INTRAVENOUS
  Filled 2015-04-20: qty 1000

## 2015-04-20 MED ORDER — LACTATED RINGERS IR SOLN
Status: DC | PRN
  Administered 2015-04-20: 1000 mL

## 2015-04-20 MED ORDER — DEXTROSE 5 % IV SOLN
INTRAVENOUS | Status: AC
  Filled 2015-04-20: qty 2

## 2015-04-20 MED ORDER — LACTATED RINGERS IV SOLN
INTRAVENOUS | Status: DC | PRN
  Administered 2015-04-20: 08:00:00 via INTRAVENOUS

## 2015-04-20 MED ORDER — OXYCODONE HCL 5 MG PO TABS: 5.0000 mg | ORAL_TABLET | ORAL | Status: DC | PRN

## 2015-04-20 MED ORDER — LACTATED RINGERS IV SOLN: INTRAVENOUS | Status: DC

## 2015-04-20 MED ORDER — ACETAMINOPHEN 650 MG RE SUPP: 650.0000 mg | Freq: Four times a day (QID) | RECTAL | Status: DC | PRN

## 2015-04-20 MED ORDER — BUPIVACAINE-EPINEPHRINE 0.25% -1:200000 IJ SOLN
INTRAMUSCULAR | Status: DC | PRN
  Administered 2015-04-20: 20 mL

## 2015-04-20 MED ORDER — SUGAMMADEX SODIUM 500 MG/5ML IV SOLN
INTRAVENOUS | Status: DC | PRN
  Administered 2015-04-20: 500 mg via INTRAVENOUS

## 2015-04-20 MED ORDER — LIDOCAINE HCL (CARDIAC) 20 MG/ML IV SOLN
INTRAVENOUS | Status: AC
  Filled 2015-04-20: qty 5

## 2015-04-20 MED ORDER — PROPOFOL 10 MG/ML IV BOLUS
INTRAVENOUS | Status: DC | PRN
  Administered 2015-04-20: 280 mg via INTRAVENOUS

## 2015-04-20 MED ORDER — FENTANYL CITRATE (PF) 250 MCG/5ML IJ SOLN
INTRAMUSCULAR | Status: DC | PRN
  Administered 2015-04-20: 100 ug via INTRAVENOUS
  Administered 2015-04-20 (×2): 50 ug via INTRAVENOUS

## 2015-04-20 SURGICAL SUPPLY — 35 items
APPLIER CLIP ROT 10 11.4 M/L (STAPLE) ×3
BENZOIN TINCTURE PRP APPL 2/3 (GAUZE/BANDAGES/DRESSINGS) ×3 IMPLANT
CABLE HIGH FREQUENCY MONO STRZ (ELECTRODE) ×3 IMPLANT
CHLORAPREP W/TINT 26ML (MISCELLANEOUS) ×3 IMPLANT
CLIP APPLIE ROT 10 11.4 M/L (STAPLE) ×1 IMPLANT
CLOSURE WOUND 1/2 X4 (GAUZE/BANDAGES/DRESSINGS) ×1
COVER MAYO STAND STRL (DRAPES) ×3 IMPLANT
COVER SURGICAL LIGHT HANDLE (MISCELLANEOUS) ×3 IMPLANT
DECANTER SPIKE VIAL GLASS SM (MISCELLANEOUS) ×3 IMPLANT
DRAPE C-ARM 42X120 X-RAY (DRAPES) ×3 IMPLANT
DRAPE LAPAROSCOPIC ABDOMINAL (DRAPES) ×3 IMPLANT
ELECT REM PT RETURN 9FT ADLT (ELECTROSURGICAL) ×3
ELECTRODE REM PT RTRN 9FT ADLT (ELECTROSURGICAL) ×1 IMPLANT
GAUZE SPONGE 2X2 8PLY STRL LF (GAUZE/BANDAGES/DRESSINGS) ×1 IMPLANT
GAUZE SPONGE 4X4 12PLY STRL (GAUZE/BANDAGES/DRESSINGS) ×3 IMPLANT
GLOVE SURG ORTHO 8.0 STRL STRW (GLOVE) ×3 IMPLANT
GOWN STRL REUS W/TWL XL LVL3 (GOWN DISPOSABLE) ×12 IMPLANT
HEMOSTAT SURGICEL 4X8 (HEMOSTASIS) IMPLANT
KIT BASIN OR (CUSTOM PROCEDURE TRAY) ×3 IMPLANT
POSITIONER SURGICAL ARM (MISCELLANEOUS) IMPLANT
POUCH SPECIMEN RETRIEVAL 10MM (ENDOMECHANICALS) ×3 IMPLANT
SCISSORS LAP 5X35 DISP (ENDOMECHANICALS) ×3 IMPLANT
SET CHOLANGIOGRAPH MIX (MISCELLANEOUS) ×3 IMPLANT
SET IRRIG TUBING LAPAROSCOPIC (IRRIGATION / IRRIGATOR) ×3 IMPLANT
SLEEVE XCEL OPT CAN 5 100 (ENDOMECHANICALS) ×3 IMPLANT
SPONGE GAUZE 2X2 STER 10/PKG (GAUZE/BANDAGES/DRESSINGS) ×2
STRIP CLOSURE SKIN 1/2X4 (GAUZE/BANDAGES/DRESSINGS) ×2 IMPLANT
SUT MNCRL AB 4-0 PS2 18 (SUTURE) ×3 IMPLANT
TAPE CLOTH 4X10 WHT NS (GAUZE/BANDAGES/DRESSINGS) IMPLANT
TAPE CLOTH SURG 4X10 WHT LF (GAUZE/BANDAGES/DRESSINGS) ×3 IMPLANT
TOWEL OR 17X26 10 PK STRL BLUE (TOWEL DISPOSABLE) ×3 IMPLANT
TRAY LAPAROSCOPIC (CUSTOM PROCEDURE TRAY) ×3 IMPLANT
TROCAR BLADELESS OPT 5 100 (ENDOMECHANICALS) ×3 IMPLANT
TROCAR XCEL BLUNT TIP 100MML (ENDOMECHANICALS) ×3 IMPLANT
TROCAR XCEL NON-BLD 11X100MML (ENDOMECHANICALS) ×3 IMPLANT

## 2015-04-20 NOTE — Transfer of Care (Signed)
Immediate Anesthesia Transfer of Care Note  Patient: Luis Wall  Procedure(s) Performed: Procedure(s): LAPAROSCOPIC CHOLECYSTECTOMY  (N/A)  Patient Location: PACU  Anesthesia Type:General  Level of Consciousness: awake, alert , oriented and patient cooperative  Airway & Oxygen Therapy: Patient Spontanous Breathing and Patient connected to face mask oxygen  Post-op Assessment: Report given to RN and Post -op Vital signs reviewed and stable  Post vital signs: Reviewed and stable  Last Vitals:  Filed Vitals:   04/20/15 0600 04/20/15 0734  BP: 123/64 133/84  Pulse: 65 71  Temp: 36.6 C 36.7 C  Resp: 18 18    Complications: No apparent anesthesia complications

## 2015-04-20 NOTE — Anesthesia Postprocedure Evaluation (Signed)
Anesthesia Post Note  Patient: Luis Wall  Procedure(s) Performed: Procedure(s) (LRB): LAPAROSCOPIC CHOLECYSTECTOMY  (N/A)  Patient location during evaluation: PACU Anesthesia Type: General Level of consciousness: awake and alert Pain management: pain level controlled Vital Signs Assessment: post-procedure vital signs reviewed and stable Respiratory status: spontaneous breathing, nonlabored ventilation, respiratory function stable and patient connected to nasal cannula oxygen Cardiovascular status: blood pressure returned to baseline and stable Postop Assessment: no signs of nausea or vomiting Anesthetic complications: no    Last Vitals:  Filed Vitals:   04/20/15 0734 04/20/15 1002  BP: 133/84 155/100  Pulse: 71 88  Temp: 36.7 C 36.7 C  Resp: 18 17    Last Pain:  Filed Vitals:   04/20/15 1016  PainSc: 8                  Sebastian Acheheodore Noeli Lavery

## 2015-04-20 NOTE — Anesthesia Procedure Notes (Signed)
Procedure Name: Intubation Date/Time: 04/20/2015 9:01 AM Performed by: Delphia GratesHANDLER, Rainie Crenshaw Pre-anesthesia Checklist: Patient identified, Emergency Drugs available, Suction available and Patient being monitored Patient Re-evaluated:Patient Re-evaluated prior to inductionOxygen Delivery Method: Circle system utilized Preoxygenation: Pre-oxygenation with 100% oxygen Intubation Type: IV induction Laryngoscope Size: Mac and 4 Grade View: Grade II Tube type: Oral Tube size: 7.5 mm Number of attempts: 1 Airway Equipment and Method: Stylet Placement Confirmation: ETT inserted through vocal cords under direct vision,  breath sounds checked- equal and bilateral and positive ETCO2 Secured at: 22 cm Tube secured with: Tape Dental Injury: Teeth and Oropharynx as per pre-operative assessment

## 2015-04-20 NOTE — Discharge Instructions (Signed)
[facility] ______[facility] °LAPAROSCOPIC SURGERY: POST OP INSTRUCTIONS °Always review your discharge instruction sheet given to you by the facility where your surgery was performed. °IF YOU HAVE DISABILITY OR FAMILY LEAVE FORMS, YOU MUST BRING THEM TO THE OFFICE FOR PROCESSING.   °DO NOT GIVE THEM TO YOUR DOCTOR. ° °1. A prescription for pain medication may be given to you upon discharge.  Take your pain medication as prescribed, if needed.  If narcotic pain medicine is not needed, then you may take acetaminophen (Tylenol) or ibuprofen (Advil) as needed. °2. Take your usually prescribed medications unless otherwise directed. °3. If you need a refill on your pain medication, please contact your pharmacy.  They will contact our office to request authorization. Prescriptions will not be filled after 5pm or on week-ends. °4. You should follow a light diet the first few days after arrival home, such as soup and crackers, etc.  Be sure to include lots of fluids daily. °5. Most patients will experience some swelling and bruising in the area of the incisions.  Ice packs will help.  Swelling and bruising can take several days to resolve.  °6. It is common to experience some constipation if taking pain medication after surgery.  Increasing fluid intake and taking a stool softener (such as Colace) will usually help or prevent this problem from occurring.  A mild laxative (Milk of Magnesia or Miralax) should be taken according to package instructions if there are no bowel movements after 48 hours. °7. Unless discharge instructions indicate otherwise, you may remove your bandages 24-48 hours after surgery, and you may shower at that time.  You may have steri-strips (small skin tapes) in place directly over the incision.  These strips should be left on the skin for 7-10 days.  If your surgeon used skin glue on the incision, you may shower in 24 hours.  The glue will flake off over the next 2-3 weeks.  Any sutures or  staples will be removed at the office during your follow-up visit. °8. ACTIVITIES:  You may resume regular (light) daily activities beginning the next day--such as daily self-care, walking, climbing stairs--gradually increasing activities as tolerated.  You may have sexual intercourse when it is comfortable.  Refrain from any heavy lifting or straining until approved by your doctor. °a. You may drive when you are no longer taking prescription pain medication, you can comfortably wear a seatbelt, and you can safely maneuver your car and apply brakes. °b. RETURN TO WORK:  __________________________________________________________ °9. You should see your doctor in the office for a follow-up appointment approximately 2-3 weeks after your surgery.  Make sure that you call for this appointment within a day or two after you arrive home to insure a convenient appointment time. °10. OTHER INSTRUCTIONS: __________________________________________________________________________________________________________________________ __________________________________________________________________________________________________________________________ °WHEN TO CALL YOUR DOCTOR: °1. Fever over 101.0 °2. Inability to urinate °3. Continued bleeding from incision. °4. Increased pain, redness, or drainage from the incision. °5. Increasing abdominal pain ° °The clinic staff is available to answer your questions during regular business hours.  Please don’t hesitate to call and ask to speak to one of the nurses for clinical concerns.  If you have a medical emergency, go to the nearest emergency room or call 911.  A surgeon from [facility] is always on call at the hospital. °[address], [address], [place], [place]  [address] ? [address] [address], [place], [place]   [address] °[phone] ? [phone] ? FAX [phone] °Web site:   [email or web address] ° °Laparoscopic Cholecystectomy, Care After °Refer to this  sheet in the next few weeks. These instructions provide you with information about caring for yourself after your procedure. Your health care provider may also give you more specific instructions. Your treatment has been planned according to current medical practices, but problems sometimes occur. Call your health care provider if you have any problems or questions after your procedure. °WHAT TO EXPECT AFTER THE PROCEDURE °After your procedure, it is common to have: °· Pain at your incision sites. You will be given pain medicines to control your pain. °· Mild nausea or vomiting. This should improve after the first 24 hours. °· Bloating and possible shoulder pain from the gas that was used during the procedure. This will improve after the first 24 hours. °HOME CARE INSTRUCTIONS °Incision Care °· Follow instructions from your health care provider about how to take care of your incisions. Make sure you: °¨ Wash your hands with soap and water before you change your bandage (dressing). If soap and water are not available, use hand sanitizer. °¨ Change your dressing as told by your health care provider. °¨ Leave stitches (sutures), skin glue, or adhesive strips in place. These skin closures may need to be in place for 2 weeks or longer. If adhesive strip edges start to loosen and curl up, you may trim the loose edges. Do not remove adhesive strips completely unless your health care provider tells you to do that. °· Do not take baths, swim, or use a hot tub until your health care provider approves. Ask your health care provider if you can take showers. You may only be allowed to take sponge baths for bathing. °General Instructions °· Take over-the-counter and prescription medicines only as told by your health care provider. °· Do not drive or operate heavy machinery while taking prescription pain medicine. °· Return to your normal diet as told by your health care provider. °· Do not lift anything that is heavier than 10 lb  (4.5 kg). °· Do not play contact sports for one week or until your health care provider approves. °SEEK MEDICAL CARE IF:  °· You have redness, swelling, or pain at the site of your incision. °· You have fluid, blood, or pus coming from your incision. °· You notice a bad smell coming from your incision area. °· Your surgical incisions break open. °· You have a fever. °SEEK IMMEDIATE MEDICAL CARE IF: °· You develop a rash. °· You have difficulty breathing. °· You have chest pain. °· You have increasing pain in your shoulders (shoulder strap areas). °· You faint or have dizzy episodes while you are standing. °· You have severe pain in your abdomen. °· You have nausea or vomiting that lasts for more than one day. °  °This information is not intended to replace advice given to you by your health care provider. Make sure you discuss any questions you have with your health care provider. °  °Document Released: 01/14/2005 Document Revised: 10/05/2014 Document Reviewed: 08/26/2012 °Elsevier Interactive Patient Education ©2016 [employer] ° °

## 2015-04-20 NOTE — Progress Notes (Signed)
Assessment unchanged. Pt and wife verbalized understanding of dc instructions through teach back including follow up care and when to call the doctor. Script x 1 given as provided by MD. Discharged via wc accompanied by wife, friend and NT to meet awaiting vehicle at front entrance.

## 2015-04-20 NOTE — Op Note (Signed)
Procedure Note  Pre-operative Diagnosis:  Chronic cholecystitis, cholelithiasis  Post-operative Diagnosis:  same  Surgeon:  Velora Hecklerodd M. See Beharry, MD, FACS  Assistant:  none   Procedure:  Laparoscopic cholecystectomy  Anesthesia:  General  Estimated Blood Loss:  minimal  Drains: none         Specimen: Gallbladder to pathology  Indications:  Patient admitted with abdominal pain.  Large gallstone 2.3cm impacted in neck of gallbladder with unrelenting pain.  Procedure Details:  The patient was seen in the pre-op holding area. The risks, benefits, complications, treatment options, and expected outcomes were previously discussed with the patient. The patient agreed with the proposed plan and has signed the informed consent form.  The patient was brought to the Operating Room, identified as Luis Wall and the procedure verified as laparoscopic cholecystectomy with intraoperative cholangiography. A "time out" was completed and the above information confirmed.  The patient was placed in the supine position. Following induction of general anesthesia, the abdomen was prepped and draped in the usual aseptic fashion.  An incision was made in the skin near the umbilicus. The midline fascia was incised and the peritoneal cavity was entered and a Hasson canula was introduced under direct vision.  The Hasson canula was secured with a 0-Vicryl pursestring suture. Pneumoperitoneum was established with carbon dioxide. Additional trocars were introduced under direct vision along the right costal margin in the midline, mid-clavicular line, and anterior axillary line.   The gallbladder was identified and the fundus grasped and retracted cephalad. Adhesions were taken down bluntly and the electrocautery was utilized as needed, taking care not to injure any adjacent structures. The infundibulum was grasped and retracted laterally, exposing the peritoneum overlying the triangle of Calot. The peritoneum was incised  and structures exposed with blunt dissection. The cystic duct was clearly identified, bluntly dissected circumferentially, and clipped at the neck of the gallbladder.  The cystic duct was then ligated with surgical clips and divided. The cystic artery was identified, dissected circumferentially, ligated with ligaclips, and divided.  The gallbladder was dissected away from the liver bed using the electrocautery for hemostasis. The gallbladder was completely removed from the liver and placed into an endocatch bag. The gallbladder was removed in the endocatch bag through the umbilical port site and submitted to pathology for review.  The right upper quadrant was irrigated and the gallbladder bed was inspected. Hemostasis was achieved with the electrocautery.  Pneumoperitoneum was released after viewing removal of the trocars with good hemostasis noted. The umbilical wound was irrigated and the fascia was then closed with the pursestring suture.  Local anesthetic was infiltrated at all port sites. The skin incisions were closed with 4-0 Monocril subcuticular sutures and steri-strips and dressings were applied.  Instrument, sponge, and needle counts were correct at the conclusion of the case.  The patient was awakened from anesthesia and brought to the recovery room in stable condition.  The patient tolerated the procedure well.   Velora Hecklerodd M. Luis Bryant, MD, [facility] Office: [phone]

## 2015-04-20 NOTE — Discharge Summary (Signed)
Physician Discharge Summary  Patient ID: Luis Wall MRN: [id number] DOB/AGE: Jun 11, 1985 30 y.o.  Admit date: 04/19/2015 Discharge date: 04/20/2015  Admission Diagnoses:  RUQ pain with 2.3 cm impacted gallstone Hx of Drug use (drug screen positive for cocaine and opiates) Tobacco use/Etoh use Hx of anxiety and depression  Discharge Diagnoses:  Same Active Problems:   Symptomatic cholelithiasis   Cholelithiasis with chronic cholecystitis   PROCEDURES:  Laparoscopic cholecystectomy, 04/20/15, Dr. Alean Rinneodd Gerkin  Hospital Course:  Chief Complaint: Abdominal pain HPI: Pt awoke 12:00 AM with abdominal pain going to his back. He has had this a couple times before. It last 2-5 hours. He had nausea and vomiting early this AM with the pain. No fever. He says symptoms come on with lying down, and at night.  Work up shows he is afebrile, BP is up. Labs are normal. US shows: 2.3 cm gallstone appears to be lodged in the neck of the gallbladder. No present ultrasound evidence of acute cholecystitis is seen however but clinical correlation is recommended.  Probable 2.5 cm hemangioma in the right lobe of liver which is only slightly atypical.  He was seen and admitted in the ED by Dr. Gerrit FriendsGerkin.  He was taken to the OR the following day.  He tolerated the procedure well and was anxious to go home that evening.  He will follow up in the Clinic in 3 weeks.  Condition on d/c:  Improved  CBC Latest Ref Rng 04/20/2015 04/19/2015 03/24/2014  WBC 4.0 - 10.5 K/uL 7.5 8.7 6.9  Hemoglobin 13.0 - 17.0 g/dL 40.914.5 81.116.2 91.416.3  Hematocrit 39.0 - 52.0 % 42.8 47.8 48.8  Platelets 150 - 400 K/uL 194 216 234   CMP Latest Ref Rng 04/20/2015 04/19/2015 03/24/2014  Glucose 65 - 99 mg/dL 90 782(N127(H) 562(Z102(H)  BUN 6 - 20 mg/dL 10 15 15   Creatinine 0.61 - 1.24 mg/dL 3.081.02 6.571.08 8.461.10  Sodium 135 - 145 mmol/L 139 140 138  Potassium 3.5 - 5.1 mmol/L 3.7 3.7 3.6  Chloride 101 - 111 mmol/L 104 104 105  CO2 22 - 32 mmol/L 28 27  25   Calcium 8.9 - 10.3 mg/dL 9.6(E8.7(L) 9.7 9.4  Total Protein 6.5 - 8.1 g/dL 6.4(L) 7.4 7.6  Total Bilirubin 0.3 - 1.2 mg/dL 0.5 0.9 0.7  Alkaline Phos 38 - 126 U/L 43 55 53  AST 15 - 41 U/L 21 31 26   ALT 17 - 63 U/L 37 49 45   TOX, URINE   Amphetamines    NONE D...    Amphetamines    Barbiturates    NONE D...    Barbiturates    Benzodiazepines    NONE D...    Benzodiazepines    Opiates    POSITIVE    Opiates    Cocaine    POSITIVE    Cocaine    Tetrahydrocannabinol    NONE D...    Tetrahydrocannabinol    MICROBIOLOGY       Condition on D/C:  improved   Disposition: 01-Home or Self Care     Medication List    STOP taking these medications        azithromycin 250 MG tablet  Commonly known as:  ZITHROMAX Z-PAK     benzonatate 100 MG capsule  Commonly known as:  TESSALON PERLES     brompheniramine-pseudoephedrine-DM 30-2-10 MG/5ML syrup      TAKE these medications        acetaminophen 500 MG tablet  Commonly known as:  TYLENOL  Take 1,000 mg by mouth every 6 (six) hours as needed for moderate pain or headache.     albuterol 108 (90 Base) MCG/ACT inhaler  Commonly known as:  PROVENTIL HFA;VENTOLIN HFA  Inhale 2 puffs into the lungs every 6 (six) hours as needed for wheezing or shortness of breath.     diclofenac 75 MG EC tablet  Commonly known as:  VOLTAREN  Take 1 tablet (75 mg total) by mouth 2 (two) times daily.     oxyCODONE 5 MG immediate release tablet  Commonly known as:  Oxy IR/ROXICODONE  Take 1-2 tablets (5-10 mg total) by mouth every 4 (four) hours as needed for moderate pain.           Follow-up Information    Follow up with [facility] On 05/10/2015.   Specialty:  General Surgery   Why:  Your appointment is at 10 AM, be at the office 30 minutes early for check in.   Contact information:   [address] [place] [address] [phone]       Signed: Sherrie George 04/20/2015, 2:42 PM

## 2015-04-23 ENCOUNTER — Emergency Department (HOSPITAL_COMMUNITY)
Admission: EM | Admit: 2015-04-23 | Discharge: 2015-04-24 | Disposition: A | Discharge status: Home / Self Care | Payer: MEDICAID | Attending: Emergency Medicine | Admitting: Emergency Medicine

## 2015-04-23 DIAGNOSIS — I10 Essential (primary) hypertension: Secondary | ICD-10-CM | POA: Insufficient documentation

## 2015-04-23 DIAGNOSIS — F1410 Cocaine abuse, uncomplicated: Secondary | ICD-10-CM | POA: Insufficient documentation

## 2015-04-23 DIAGNOSIS — T6591XA Toxic effect of unspecified substance, accidental (unintentional), initial encounter: Secondary | ICD-10-CM

## 2015-04-23 DIAGNOSIS — Z79899 Other long term (current) drug therapy: Secondary | ICD-10-CM | POA: Insufficient documentation

## 2015-04-23 DIAGNOSIS — F1310 Sedative, hypnotic or anxiolytic abuse, uncomplicated: Secondary | ICD-10-CM | POA: Insufficient documentation

## 2015-04-23 DIAGNOSIS — G8918 Other acute postprocedural pain: Secondary | ICD-10-CM | POA: Insufficient documentation

## 2015-04-23 DIAGNOSIS — Z9049 Acquired absence of other specified parts of digestive tract: Secondary | ICD-10-CM | POA: Insufficient documentation

## 2015-04-23 DIAGNOSIS — T426X1A Poisoning by other antiepileptic and sedative-hypnotic drugs, accidental (unintentional), initial encounter: Secondary | ICD-10-CM | POA: Insufficient documentation

## 2015-04-23 DIAGNOSIS — F17210 Nicotine dependence, cigarettes, uncomplicated: Secondary | ICD-10-CM | POA: Insufficient documentation

## 2015-04-23 LAB — CBC
HCT: 44.8 % (ref 39.0–52.0)
HEMOGLOBIN: 15.5 g/dL (ref 13.0–17.0)
MCH: 28.2 pg (ref 26.0–34.0)
MCHC: 34.6 g/dL (ref 30.0–36.0)
MCV: 81.6 fL (ref 78.0–100.0)
Platelets: 219 10*3/uL (ref 150–400)
RBC: 5.49 MIL/uL (ref 4.22–5.81)
RDW: 12.7 % (ref 11.5–15.5)
WBC: 7.5 10*3/uL (ref 4.0–10.5)

## 2015-04-23 LAB — ETHANOL

## 2015-04-23 LAB — COMPREHENSIVE METABOLIC PANEL
ALK PHOS: 51 U/L (ref 38–126)
ALT: 55 U/L (ref 17–63)
ANION GAP: 8 (ref 5–15)
AST: 31 U/L (ref 15–41)
Albumin: 4.1 g/dL (ref 3.5–5.0)
BILIRUBIN TOTAL: 0.4 mg/dL (ref 0.3–1.2)
BUN: 11 mg/dL (ref 6–20)
CALCIUM: 9.3 mg/dL (ref 8.9–10.3)
CHLORIDE: 105 mmol/L (ref 101–111)
CO2: 27 mmol/L (ref 22–32)
CREATININE: 1.19 mg/dL (ref 0.61–1.24)
GFR calc non Af Amer: >60 mL/min (ref >60)
GLUCOSE: 95 mg/dL (ref 65–99)
Potassium: 3.8 mmol/L (ref 3.5–5.1)
Sodium: 140 mmol/L (ref 135–145)
Total Protein: 7.2 g/dL (ref 6.5–8.1)

## 2015-04-23 LAB — RAPID URINE DRUG SCREEN, HOSP PERFORMED
AMPHETAMINES: NOT DETECTED
BARBITURATES: NOT DETECTED
BENZODIAZEPINES: POSITIVE — AB
Cocaine: POSITIVE — AB
Opiates: NOT DETECTED
Tetrahydrocannabinol: NOT DETECTED

## 2015-04-23 LAB — ACETAMINOPHEN LEVEL

## 2015-04-23 LAB — LIPASE, BLOOD: Lipase: 30 U/L (ref 11–51)

## 2015-04-23 LAB — SALICYLATE LEVEL

## 2015-04-23 MED ORDER — OXYCODONE HCL 5 MG PO TABS
10.0000 mg | ORAL_TABLET | Freq: Once | ORAL | Status: AC
  Administered 2015-04-23: 10 mg via ORAL
  Filled 2015-04-23: qty 2

## 2015-04-23 NOTE — ED Notes (Signed)
Poison Control called and sts that the pt girlfriend called and reports that pt ingested 10 lamotrigine and ETOH at an unknown time today. Poison control adds that pt is lethargic. Poison control recommends that pt be monitored for no less that 5 hrs for widening QRS, EKG, Labs: CDC w/ diff, CMP, Salicylate, Acetaminophen. Poison control will follow up with dept later this evening

## 2015-04-23 NOTE — ED Notes (Signed)
Patient belongings bag by nurses station contains 1 pair of blue jeans, Yellow shirt, tan undergarment, and 1 pair of black and pink shoes.  Bag is labeled at the nurses station.

## 2015-04-23 NOTE — ED Notes (Addendum)
Pt states he had his gallbladder removed on 3/23 and is still having abdominal pain. States he has been eating light and denies any greasy foods. Alert and oriented.   Addendum: Charge nurse was aware of this patient as poison controlled had called and stated that he had taken 10 of his girlfriend's lamotrigine with an unknown amount of alcohol. Pt made no mention of this.

## 2015-04-23 NOTE — ED Notes (Addendum)
Pt reported pain in navel area s/p to chole sx on Wednesday. No redness or drainage for sx sites. Pt denies fever, n/v and diarrhea. Pt denies taking Lamictal and drinking any alcohol today.

## 2015-04-23 NOTE — ED Provider Notes (Signed)
CSN: [id number]     Arrival date & time 04/23/15  1759 History   First MD Initiated Contact with Patient 04/23/15 1820     Chief Complaint  Patient presents with  . Abdominal Pain  . Ingestion     (Consider location/radiation/quality/duration/timing/severity/associated sxs/prior Treatment) Patient is a 30 y.o. male presenting with abdominal pain and Ingested Medication. The history is provided by the patient and medical records.  Abdominal Pain Associated symptoms: no diarrhea, no nausea and no vomiting   Ingestion Associated symptoms include abdominal pain. Pertinent negatives include no nausea or vomiting.    30 y.o. M with hx of anxiety, HTN, presenting to the ED for abdominal pain.  Patient had a laparoscopic cholecystectomy with Dr. Gerrit Friends on 04/20/15.  He reports he stayed overnight in the hospital but went home the following morning.  Surgery went well, no noted complications to his knowledge.   He states since returning home he has overall been doing well. He reports he does not feel that his pain is controlled, he states he does not feel his medications are strong enough. He has not had any nausea, vomiting, or diarrhea. No fever or chills. He states he has been following the gentle diet as recommended to him. He states he is on home oxycodone , 1-2 tabs every 4 hours for pain.  No fever, chills, sweats.    Of note, patient's wife apparently called poison control as she reported patient ingested 10 lamotrigine tablets as well as alcohol today. Patient adamantly denies this. He states he does not even know what lamotrigine is. He reports he did take some leftover gabapentin as an extra pain reliever. He reports this prescription was his from several months ago when he was having some leg pain. He denies any alcohol or illicit drug use.  He denies any suicidal or homicidal ideation.  No hallucinations.  VSS.  Past Medical History  Diagnosis Date  . Hypertension   . Anxiety   . Anxiety     Past Surgical History  Procedure Laterality Date  . Tonsillectomy    . Wisdom tooth extraction    . Cholecystectomy N/A 04/20/2015    Procedure: LAPAROSCOPIC CHOLECYSTECTOMY ;  Surgeon: Darnell Level, MD;  Location: [facility];  Service: General;  Laterality: N/A;   Family History  Problem Relation Age of Onset  . Hypertension Mother    Social History  Substance Use Topics  . Smoking status: Current Every Day Smoker -- 0.50 packs/day    Types: Cigarettes  . Smokeless tobacco: Never Used  . Alcohol Use: Yes     Comment: once a week     Review of Systems  Gastrointestinal: Positive for abdominal pain. Negative for nausea, vomiting and diarrhea.  All other systems reviewed and are negative.     Allergies  Flexeril; Ibuprofen; Naproxen; and Tramadol  Home Medications   Prior to Admission medications   Medication Sig Start Date End Date Taking? Authorizing Provider  acetaminophen (TYLENOL) 500 MG tablet Take 500-1,000 mg by mouth every 6 (six) hours as needed for moderate pain or headache.    Yes Historical Provider, MD  albuterol (PROVENTIL HFA;VENTOLIN HFA) 108 (90 BASE) MCG/ACT inhaler Inhale 2 puffs into the lungs every 6 (six) hours as needed for wheezing or shortness of breath. 01/23/15  Yes Jenise V Bacon Menshew, PA-C  diclofenac (VOLTAREN) 75 MG EC tablet Take 1 tablet (75 mg total) by mouth 2 (two) times daily. Patient taking differently: Take 75 mg by mouth 2 (  two) times daily as needed for mild pain.  12/03/14  Yes Tommi Rumps, PA-C  oxyCODONE (OXY IR/ROXICODONE) 5 MG immediate release tablet Take 1-2 tablets (5-10 mg total) by mouth every 4 (four) hours as needed for moderate pain. 04/20/15  Yes Sherrie George, PA-C   BP 153/91 mmHg  Pulse 64  Temp(Src) 98.4 F (36.9 C) (Oral)  Resp 18  SpO2 100%   Physical Exam  Constitutional: He is oriented to person, place, and time. He appears well-developed and well-nourished. No distress.  HENT:  Head: Normocephalic  and atraumatic.  Mouth/Throat: Oropharynx is clear and moist.  Eyes: Conjunctivae and EOM are normal. Pupils are equal, round, and reactive to light.  Neck: Normal range of motion. Neck supple.  Cardiovascular: Normal rate, regular rhythm and normal heart sounds.   Pulmonary/Chest: Effort normal and breath sounds normal. No respiratory distress. He has no wheezes.  Abdominal: Soft. Bowel sounds are normal. There is no tenderness. There is no guarding.  Laparoscopic cholecystectomy scars noted to abdomen with sutures and steri-strips in place; overall clean without drainage or erythema No significant tenderness with palpation of abdomen, no rigidity, no guarding  Musculoskeletal: Normal range of motion.  Neurological: He is alert and oriented to person, place, and time.  Skin: Skin is warm and dry. He is not diaphoretic.  Psychiatric: He has a normal mood and affect.  Nursing note and vitals reviewed.   ED Course  Procedures (including critical care time) Labs Review Labs Reviewed  URINE RAPID DRUG SCREEN, HOSP PERFORMED - Abnormal; Notable for the following:    Cocaine POSITIVE (*)    Benzodiazepines POSITIVE (*)    All other components within normal limits  ACETAMINOPHEN LEVEL - Abnormal; Notable for the following:    Acetaminophen (Tylenol), Serum <10 (*)    All other components within normal limits  LIPASE, BLOOD  COMPREHENSIVE METABOLIC PANEL  CBC  ETHANOL  SALICYLATE LEVEL    Imaging Review No results found. I have personally reviewed and evaluated these images and lab results as part of my medical decision-making.   EKG Interpretation None      MDM   Final diagnoses:  Post-operative pain  Ingestion of unknown medication, initial encounter    30 year old male here with abdominal pain. He had a laparoscopic cholecystectomy 3 days ago , surgery was without noted complications. He denies any significant change in his pain, however he does not feel it is controlled  at home with his current medications. Patient is afebrile, nontoxic. His vital signs are stable. His incisions appear to be healing well and there are no signs of infection currently. He has no significant tenderness of the abdomen, no rigidity, no guarding, no peritonitis. Patient is currently tolerating oral fluids and requesting to eat. His lab work is reassuring. Wife called poison control today as she reported patient took 10 lamotrigine tablets and drank EtOH today. Patient reports he does not know what this medication is, rather he took some leftover gabapentin to help aid his pain relief. Denies any alcohol today.  He denies any current chest pain, shortness of breath, palpitations, dizziness, or weakness.  He denied illicit drug use, his UDS is positive for cocaine, however is not revealing any opiates today.  Poison control recommends to observe for 5 hours to ensure no acute EKG changes.  Have attempted to speak with patient's wife, she has left the ED and cannot provide any history currently.  11:19 PM Patient has been observed for the recommended  5 hours. No acute events. His vital signs have remained stable. Patient has tolerated full meal here in the emergency department without difficulty. Given his baseline 10mg  oxycodone with improvement of pain.  Do not suspect any acute post-operative complications given benign appearance, reassuring labs, stable VS, and tolerating PO.  Regarding ingestion of alleged lamotrigine, patient maintains this was gabapentin in attempt to help with pain relief.  Continues to deny and SI/HI/AVH.  Patient's wife never returned to ED to give additional info.  Patient does not appear to be a danger to himself at this time, feel he is stable for discharge.  Will follow-up with general surgery clinic as scheduled.  Discussed plan with patient, he/she acknowledged understanding and agreed with plan of care.  Return precautions given for new or worsening symptoms.  Garlon HatchetLisa M  Zaynab Chipman, PA-C 04/24/15 0006  Lorre NickAnthony Allen, MD 04/27/15 [phone]

## 2015-04-23 NOTE — Discharge Instructions (Signed)
Continue your regular home pain medications as prescribed. Follow-up with the general surgery clinic as scheduled.  If you have issues with your pain medication, please call their office. Return here for new concerns.

## 2015-04-23 NOTE — ED Notes (Signed)
Fluid and food offered per PA

## 2015-04-24 NOTE — ED Notes (Signed)
Patient d/c'd self care.  F/U reviewed.  Patient verbalized understanding. 

## 2015-06-13 ENCOUNTER — Emergency Department (HOSPITAL_COMMUNITY)
Admission: EM | Admit: 2015-06-13 | Discharge: 2015-06-13 | Disposition: A | Discharge status: Home / Self Care | Payer: Self-pay | Attending: Emergency Medicine | Admitting: Emergency Medicine

## 2015-06-13 ENCOUNTER — Encounter (HOSPITAL_COMMUNITY): Payer: Self-pay

## 2015-06-13 ENCOUNTER — Emergency Department (HOSPITAL_COMMUNITY): Payer: Self-pay

## 2015-06-13 DIAGNOSIS — Y999 Unspecified external cause status: Secondary | ICD-10-CM | POA: Insufficient documentation

## 2015-06-13 DIAGNOSIS — Y929 Unspecified place or not applicable: Secondary | ICD-10-CM | POA: Insufficient documentation

## 2015-06-13 DIAGNOSIS — Z791 Long term (current) use of non-steroidal anti-inflammatories (NSAID): Secondary | ICD-10-CM | POA: Insufficient documentation

## 2015-06-13 DIAGNOSIS — Z79891 Long term (current) use of opiate analgesic: Secondary | ICD-10-CM | POA: Insufficient documentation

## 2015-06-13 DIAGNOSIS — S39012A Strain of muscle, fascia and tendon of lower back, initial encounter: Secondary | ICD-10-CM | POA: Insufficient documentation

## 2015-06-13 DIAGNOSIS — W010XXA Fall on same level from slipping, tripping and stumbling without subsequent striking against object, initial encounter: Secondary | ICD-10-CM | POA: Insufficient documentation

## 2015-06-13 DIAGNOSIS — I10 Essential (primary) hypertension: Secondary | ICD-10-CM | POA: Insufficient documentation

## 2015-06-13 DIAGNOSIS — Y939 Activity, unspecified: Secondary | ICD-10-CM | POA: Insufficient documentation

## 2015-06-13 DIAGNOSIS — Z7951 Long term (current) use of inhaled steroids: Secondary | ICD-10-CM | POA: Insufficient documentation

## 2015-06-13 DIAGNOSIS — F17210 Nicotine dependence, cigarettes, uncomplicated: Secondary | ICD-10-CM | POA: Insufficient documentation

## 2015-06-13 MED ORDER — OXYCODONE-ACETAMINOPHEN 5-325 MG PO TABS
1.0000 | ORAL_TABLET | Freq: Once | ORAL | Status: AC
  Administered 2015-06-13: 1 via ORAL
  Filled 2015-06-13: qty 1

## 2015-06-13 NOTE — ED Provider Notes (Signed)
CSN: [id number]     Arrival date & time 06/13/15  0746 History   First MD Initiated Contact with Patient 06/13/15 [phone]     Chief Complaint  Patient presents with  . Fall  . Back Pain     (Consider location/radiation/quality/duration/timing/severity/associated sxs/prior Treatment) HPI Comments: 30yo M w/ chronic back pain who p/w back pain. Last night, the patient slipped and fell onto his right side, no head injury or LOC. He endorses severe 10/10 pain in his mid to lower back, right-sided, and it radiates into his R hip and leg. He has been able to ambulate but with pain. No extremity or groin numbness, no bowel/bladder problems. No other injuries. He took his last dose of oxycodone at 3 AM last night. He has a follow-up appointment with a back specialist in 3 days but was told to come here since he couldn't get a sooner appt.  Patient is a 30 y.o. male presenting with fall and back pain. The history is provided by the patient.  Fall  Back Pain   Past Medical History  Diagnosis Date  . Anxiety   . Anxiety   . Hypertension    Past Surgical History  Procedure Laterality Date  . Tonsillectomy    . Wisdom tooth extraction    . Cholecystectomy N/A 04/20/2015    Procedure: LAPAROSCOPIC CHOLECYSTECTOMY ;  Surgeon: Darnell Levelodd Gerkin, MD;  Location: [facility];  Service: General;  Laterality: N/A;   Family History  Problem Relation Age of Onset  . Hypertension Mother    Social History  Substance Use Topics  . Smoking status: Current Every Day Smoker -- 0.50 packs/day    Types: Cigarettes  . Smokeless tobacco: Never Used  . Alcohol Use: Yes     Comment: once a week     Review of Systems  Musculoskeletal: Positive for back pain.   10 Systems reviewed and are negative for acute change except as noted in the HPI.    Allergies  Flexeril; Ibuprofen; Naproxen; and Tramadol  Home Medications   Prior to Admission medications   Medication Sig Start Date End Date Taking? Authorizing  Provider  albuterol (PROVENTIL HFA;VENTOLIN HFA) 108 (90 BASE) MCG/ACT inhaler Inhale 2 puffs into the lungs every 6 (six) hours as needed for wheezing or shortness of breath. 01/23/15  Yes Jenise V Bacon Menshew, PA-C  diclofenac (VOLTAREN) 75 MG EC tablet Take 1 tablet (75 mg total) by mouth 2 (two) times daily. Patient taking differently: Take 75 mg by mouth 2 (two) times daily as needed for mild pain.  12/03/14  Yes Tommi Rumpshonda L Summers, PA-C  oxyCODONE (OXY IR/ROXICODONE) 5 MG immediate release tablet Take 1-2 tablets (5-10 mg total) by mouth every 4 (four) hours as needed for moderate pain. 04/20/15  Yes Sherrie GeorgeWillard Jennings, PA-C   BP 115/83 mmHg  Pulse 51  Temp(Src) 98.3 F (36.8 C) (Oral)  Resp 18  Ht 6\' 1"  (1.854 m)  Wt 250 lb (113.399 kg)  BMI 32.99 kg/m2  SpO2 94% Physical Exam  Constitutional: He is oriented to person, place, and time. He appears well-developed and well-nourished. No distress.  Laying on L side  HENT:  Head: Normocephalic and atraumatic.  Moist mucous membranes  Eyes: Conjunctivae are normal. Pupils are equal, round, and reactive to light.  Neck: Neck supple.  Cardiovascular: Normal rate, regular rhythm and normal heart sounds.   No murmur heard. Pulmonary/Chest: Effort normal and breath sounds normal.  Abdominal: Soft. Bowel sounds are normal. He exhibits no  distension. There is no tenderness.  Musculoskeletal: He exhibits no edema.  Diffuse tenderness along lower thoracic and upper lumbar spine, R paraspinal muscles, and R flank without stepoff or obvious injury; limited movement of R leg 2/2 pain, normal sensation throughout  Neurological: He is alert and oriented to person, place, and time. He has normal reflexes.  Fluent speech  Skin: Skin is warm and dry.  No ecchymoses  Psychiatric: He has a normal mood and affect. Judgment normal.  Nursing note and vitals reviewed.   ED Course  Procedures (including critical care time) Labs Review Labs Reviewed - No  data to display  Imaging Review Dg Thoracic Spine 2 View  06/13/2015  CLINICAL DATA:  Larey Seat onto right side last night. Thoracic back pain. Initial encounter. EXAM: THORACIC SPINE 2 VIEWS COMPARISON:  05/05/2012 FINDINGS: There is no evidence of thoracic spine fracture. Alignment is normal. No other significant bone abnormalities are identified. IMPRESSION: Negative thoracic spine radiographs. Electronically Signed   By: Myles Rosenthal M.D.   On: 06/13/2015 09:02   Dg Lumbar Spine 2-3 Views  06/13/2015  CLINICAL DATA:  Fall onto right side. Low back pain radiating to right leg. Initial encounter. 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 There is no evidence of lumbar spine fracture. Alignment is normal. Intervertebral disc spaces are maintained. No osseous abnormality identified. IMPRESSION: Negative lumbar spine radiographs. Electronically Signed   By: Myles Rosenthal M.D.   On: 06/13/2015 09:05     Medications  oxyCODONE-acetaminophen (PERCOCET/ROXICET) 5-325 MG per tablet 1 tablet (1 tablet Oral Given 06/13/15 0853)     MDM   Final diagnoses:  Back strain, initial encounter    Patient with right-sided thoracolumbar back pain after he fell onto his right side last night. Patient does note a history of low back pain related to several falls and injuries over the years. He is on oxycodone chronically. On exam, he was well-appearing with reassuring vital signs. He was reluctant to move his right leg due to pain but hadn't normal sensation and normal reflexes. The patient Percocet and obtained plain films which were normal.  Patient demonstrates no lower extremity weakness, saddle anesthesia, bowel or bladder incontinence, or any other neurologic deficits concerning for cauda equina. No fevers or other infectious symptoms to suggest by the patient's back pain is due  to an infection. I have reviewed return precautions, including the development of any of these signs or symptoms, and the patient has voiced understanding. I reviewed supportive care instructions, including early range of motion exercises, and specialist f/u as previously scheduled. Patient voiced understanding and was discharged in satisfactory condition.   Laurence Spates, MD 06/13/15 [phone]

## 2015-06-13 NOTE — ED Notes (Signed)
Bed: [id number] Expected date:  Expected time:  Means of arrival:  Comments: EMS lower back pain

## 2015-06-13 NOTE — ED Notes (Signed)
Patient fell last night onto R side. Patient complains of pain to L1-L2 distribution.  Patient complains of pain 10/10. Patient comes from home via EMS.

## 2015-07-13 ENCOUNTER — Encounter (HOSPITAL_COMMUNITY): Payer: Self-pay

## 2015-07-13 ENCOUNTER — Emergency Department (HOSPITAL_COMMUNITY)
Admission: EM | Admit: 2015-07-13 | Discharge: 2015-07-14 | Disposition: A | Discharge status: Home / Self Care | Payer: Self-pay | Attending: Emergency Medicine | Admitting: Emergency Medicine

## 2015-07-13 DIAGNOSIS — F419 Anxiety disorder, unspecified: Secondary | ICD-10-CM | POA: Insufficient documentation

## 2015-07-13 DIAGNOSIS — F1920 Other psychoactive substance dependence, uncomplicated: Secondary | ICD-10-CM | POA: Insufficient documentation

## 2015-07-13 DIAGNOSIS — R45851 Suicidal ideations: Secondary | ICD-10-CM | POA: Insufficient documentation

## 2015-07-13 DIAGNOSIS — F17210 Nicotine dependence, cigarettes, uncomplicated: Secondary | ICD-10-CM | POA: Insufficient documentation

## 2015-07-13 DIAGNOSIS — I10 Essential (primary) hypertension: Secondary | ICD-10-CM | POA: Insufficient documentation

## 2015-07-13 LAB — CBC WITH DIFFERENTIAL/PLATELET
Basophils Absolute: 0 10*3/uL (ref 0.0–0.1)
Basophils Relative: 0 %
EOS ABS: 0.3 10*3/uL (ref 0.0–0.7)
Eosinophils Relative: 6 %
HEMATOCRIT: 44 % (ref 39.0–52.0)
HEMOGLOBIN: 15.7 g/dL (ref 13.0–17.0)
LYMPHS ABS: 2.4 10*3/uL (ref 0.7–4.0)
Lymphocytes Relative: 44 %
MCH: 28.2 pg (ref 26.0–34.0)
MCHC: 35.7 g/dL (ref 30.0–36.0)
MCV: 79.1 fL (ref 78.0–100.0)
MONO ABS: 0.3 10*3/uL (ref 0.1–1.0)
MONOS PCT: 6 %
NEUTROS PCT: 44 %
Neutro Abs: 2.4 10*3/uL (ref 1.7–7.7)
Platelets: 186 10*3/uL (ref 150–400)
RBC: 5.56 MIL/uL (ref 4.22–5.81)
RDW: 13.2 % (ref 11.5–15.5)
WBC: 5.4 10*3/uL (ref 4.0–10.5)

## 2015-07-13 LAB — COMPREHENSIVE METABOLIC PANEL
ALBUMIN: 4.3 g/dL (ref 3.5–5.0)
ALK PHOS: 47 U/L (ref 38–126)
ALT: 34 U/L (ref 17–63)
ANION GAP: 6 (ref 5–15)
AST: 23 U/L (ref 15–41)
BILIRUBIN TOTAL: 1 mg/dL (ref 0.3–1.2)
BUN: 13 mg/dL (ref 6–20)
CALCIUM: 9 mg/dL (ref 8.9–10.3)
CO2: 24 mmol/L (ref 22–32)
Chloride: 109 mmol/L (ref 101–111)
Creatinine, Ser: 1.06 mg/dL (ref 0.61–1.24)
GFR calc Af Amer: >60 mL/min (ref >60)
GFR calc non Af Amer: >60 mL/min (ref >60)
GLUCOSE: 109 mg/dL — AB (ref 65–99)
Potassium: 2.9 mmol/L — ABNORMAL LOW (ref 3.5–5.1)
SODIUM: 139 mmol/L (ref 135–145)
Total Protein: 7 g/dL (ref 6.5–8.1)

## 2015-07-13 LAB — ACETAMINOPHEN LEVEL: Acetaminophen (Tylenol), Serum: <10 ug/mL — ABNORMAL LOW (ref 10–30)

## 2015-07-13 LAB — ETHANOL: Alcohol, Ethyl (B): <5 mg/dL (ref <5)

## 2015-07-13 LAB — SALICYLATE LEVEL

## 2015-07-13 MED ORDER — ALUM & MAG HYDROXIDE-SIMETH 200-200-20 MG/5ML PO SUSP: 30.0000 mL | ORAL | Status: DC | PRN

## 2015-07-13 MED ORDER — CLONIDINE HCL 0.1 MG PO TABS: 0.1000 mg | ORAL_TABLET | ORAL | Status: DC

## 2015-07-13 MED ORDER — LORAZEPAM 1 MG PO TABS: 1.0000 mg | ORAL_TABLET | Freq: Three times a day (TID) | ORAL | Status: DC | PRN

## 2015-07-13 MED ORDER — DICYCLOMINE HCL 20 MG PO TABS: 20.0000 mg | ORAL_TABLET | Freq: Four times a day (QID) | ORAL | Status: DC | PRN

## 2015-07-13 MED ORDER — LOPERAMIDE HCL 2 MG PO CAPS: 2.0000 mg | ORAL_CAPSULE | ORAL | Status: DC | PRN

## 2015-07-13 MED ORDER — ONDANSETRON 4 MG PO TBDP: 4.0000 mg | ORAL_TABLET | Freq: Four times a day (QID) | ORAL | Status: DC | PRN

## 2015-07-13 MED ORDER — ACETAMINOPHEN 325 MG PO TABS
650.0000 mg | ORAL_TABLET | ORAL | Status: DC | PRN
  Administered 2015-07-13: 650 mg via ORAL
  Filled 2015-07-13: qty 2

## 2015-07-13 MED ORDER — POTASSIUM CHLORIDE CRYS ER 20 MEQ PO TBCR
40.0000 meq | EXTENDED_RELEASE_TABLET | Freq: Once | ORAL | Status: AC
  Administered 2015-07-13: 40 meq via ORAL
  Filled 2015-07-13: qty 2

## 2015-07-13 MED ORDER — ONDANSETRON HCL 4 MG PO TABS
4.0000 mg | ORAL_TABLET | Freq: Three times a day (TID) | ORAL | Status: DC | PRN
  Administered 2015-07-13: 4 mg via ORAL
  Filled 2015-07-13: qty 1

## 2015-07-13 MED ORDER — NICOTINE 21 MG/24HR TD PT24
21.0000 mg | MEDICATED_PATCH | Freq: Every day | TRANSDERMAL | Status: DC
Start: 2015-07-13 — End: 2015-07-14
  Administered 2015-07-13: 21 mg via TRANSDERMAL
  Filled 2015-07-13: qty 1

## 2015-07-13 MED ORDER — CLONIDINE HCL 0.1 MG PO TABS
0.1000 mg | ORAL_TABLET | Freq: Four times a day (QID) | ORAL | Status: DC
  Administered 2015-07-13: 0.1 mg via ORAL
  Filled 2015-07-13: qty 1

## 2015-07-13 MED ORDER — METHOCARBAMOL 500 MG PO TABS: 500.0000 mg | ORAL_TABLET | Freq: Three times a day (TID) | ORAL | Status: DC | PRN

## 2015-07-13 MED ORDER — CLONIDINE HCL 0.1 MG PO TABS: 0.1000 mg | ORAL_TABLET | Freq: Every day | ORAL | Status: DC

## 2015-07-13 MED ORDER — LORAZEPAM 1 MG PO TABS: 0.0000 mg | ORAL_TABLET | Freq: Two times a day (BID) | ORAL | Status: DC

## 2015-07-13 MED ORDER — LORAZEPAM 1 MG PO TABS
0.0000 mg | ORAL_TABLET | Freq: Four times a day (QID) | ORAL | Status: DC
  Administered 2015-07-13: 2 mg via ORAL
  Filled 2015-07-13: qty 2

## 2015-07-13 MED ORDER — HYDROXYZINE HCL 25 MG PO TABS: 25.0000 mg | ORAL_TABLET | Freq: Four times a day (QID) | ORAL | Status: DC | PRN

## 2015-07-13 MED ORDER — ZOLPIDEM TARTRATE 5 MG PO TABS
5.0000 mg | ORAL_TABLET | Freq: Every evening | ORAL | Status: DC | PRN
Start: 2015-07-13 — End: 2015-07-14

## 2015-07-13 NOTE — Consult Note (Signed)
[facility] Consult   Reason for Consult:  Psychiatric evaluation Referring Physician:  EDP Patient Identification: Luis Wall MRN:  [id number] Principal Diagnosis: <principal problem not specified> Diagnosis:   Patient Active Problem List   Diagnosis Date Noted  . Cholelithiasis with chronic cholecystitis [K80.10] 04/20/2015  . Symptomatic cholelithiasis [K80.20] 04/19/2015  . Polysubstance (including opioids) dependence with physiol dependence (Ryan) [F19.20] 10/17/2012    Total Time spent with patient: 20 minutes  Subjective:   Luis Wall is a 30 y.o. male patient who states "I am ready to go. I don't want to be here anymore."  HPI:  Luis Wall is a 30 yo male who presented to [name] ED voluntarily for substance abuse treatment. This patient was previously assessed by the therapeutic triage department earlier in the shift (refer to note by Curlene Dolphin). Since that assessment, Luis Wall expressed to the staff nurse, Luis Wall,  that he did not want to be in the hospital and wanted to go home.   Luis Wall is seen face-to-face for reassessment. Patient is alert and oriented. Patient states that he came to the ED wanting detox from opiates and cocaine. UDS result not available but patient states he last used both substances yesterday. He states he uses 2-3 grams of cocaine daily and 8 Percocet pills daily. His current COWS is 1; he denies symptoms of withdrawal. No diaphoresis, restlessness, tremor, anxiety or irritability are noted on this assessment.   Patient initially verbalized that he was having suicidal ideation without plan or intent. During this assessment, he is adamantly denying suicidal ideation, intent or plan. He expresses that he thought he would go straight into substance abuse treatment rather than being placed in the [facility] area, which he does not like. Discussed with patient about overnight stay in observation unit and patient declines this treatment option  as well. Patient states that he has stable housing at this time and will be going home with his sister. He denies access to weapons. He verbally contracts for safety.   He states that he has been on medication in the past for depression but is unable to recall the medication. He states he has received medication management from [facility] in the past.    Past Psychiatric History: Anxiety, Substance Abuse  Risk to Self: Suicidal Ideation: No-Not Currently/Within Last 6 Months Suicidal Intent: No Is patient at risk for suicide?: No Suicidal Plan?: No Access to Means: No What has been your use of drugs/alcohol within the last 12 months?: Pain pills and crack How many times?: 0 Other Self Harm Risks: None Triggers for Past Attempts: None known Intentional Self Injurious Behavior: None Risk to Others: Homicidal Ideation: No Thoughts of Harm to Others: No Current Homicidal Intent: No Current Homicidal Plan: No Access to Homicidal Means: No Identified Victim: No one History of harm to others?: No Assessment of Violence: None Noted Violent Behavior Description: None Does patient have access to weapons?: No Criminal Charges Pending?: No Does patient have a court date: No Prior Inpatient Therapy: Prior Inpatient Therapy: No Prior Therapy Dates: None Prior Therapy Facilty/Provider(s): N/A Reason for Treatment: N/A Prior Outpatient Therapy: Prior Outpatient Therapy: Yes Prior Therapy Dates: Around January '17 Prior Therapy Facilty/Provider(s): [facility] Reason for Treatment: med management Does patient have an ACCT team?: No Does patient have Intensive In-House Services?  : No Does patient have [facility] services? : No Does patient have P4CC services?: No  Past Medical History:  Past Medical History  Diagnosis Date  . Anxiety   .  Anxiety   . Hypertension     Past Surgical History  Procedure Laterality Date  . Tonsillectomy    . Wisdom tooth extraction    . Cholecystectomy N/A  04/20/2015    Procedure: LAPAROSCOPIC CHOLECYSTECTOMY ;  Surgeon: Armandina Gemma, MD;  Location: [facility];  Service: General;  Laterality: N/A;   Family History:  Family History  Problem Relation Age of Onset  . Hypertension Mother    Family Psychiatric  History: Unknown Social History:  History  Alcohol Use  . Yes    Comment: socially     History  Drug Use  . Yes  . Special: Cocaine    Comment: percocet (not his RX)    Social History   Social History  . Marital Status: Married    Spouse Name: N/A  . Number of Children: N/A  . Years of Education: N/A   Social History Main Topics  . Smoking status: Current Every Day Smoker -- 0.50 packs/day    Types: Cigarettes  . Smokeless tobacco: Never Used  . Alcohol Use: Yes     Comment: socially  . Drug Use: Yes    Special: Cocaine     Comment: percocet (not his RX)  . Sexual Activity: Yes    Birth Control/ Protection: Condom   Other Topics Concern  . None   Social History Narrative   Additional Social History:    Allergies:   Allergies  Allergen Reactions  . Flexeril [Cyclobenzaprine] Other (See Comments)    nightmares  . Ibuprofen Nausea Only  . Naproxen Nausea And Vomiting  . Tramadol Itching and Nausea Only    Labs:  Results for orders placed or performed during the hospital encounter of 07/13/15 (from the past 48 hour(s))  Comprehensive metabolic panel     Status: Abnormal   Collection Time: 07/13/15  8:22 PM  Result Value Ref Range   Sodium 139 135 - 145 mmol/L   Potassium 2.9 (L) 3.5 - 5.1 mmol/L   Chloride 109 101 - 111 mmol/L   CO2 24 22 - 32 mmol/L   Glucose, Bld 109 (H) 65 - 99 mg/dL   BUN 13 6 - 20 mg/dL   Creatinine, Ser 1.06 0.61 - 1.24 mg/dL   Calcium 9.0 8.9 - 10.3 mg/dL   Total Protein 7.0 6.5 - 8.1 g/dL   Albumin 4.3 3.5 - 5.0 g/dL   AST 23 15 - 41 U/L   ALT 34 17 - 63 U/L   Alkaline Phosphatase 47 38 - 126 U/L   Total Bilirubin 1.0 0.3 - 1.2 mg/dL   GFR calc non Af Amer >60 >60 mL/min    GFR calc Af Amer >60 >60 mL/min    Comment: (NOTE) The eGFR has been calculated using the CKD EPI equation. This calculation has not been validated in all clinical situations. eGFR's persistently <60 mL/min signify possible Chronic Kidney Disease.    Anion gap 6 5 - 15  Ethanol     Status: None   Collection Time: 07/13/15  8:22 PM  Result Value Ref Range   Alcohol, Ethyl (B) <5 <5 mg/dL    Comment:        LOWEST DETECTABLE LIMIT FOR SERUM ALCOHOL IS 5 mg/dL FOR MEDICAL PURPOSES ONLY   CBC with Diff     Status: None   Collection Time: 07/13/15  8:22 PM  Result Value Ref Range   WBC 5.4 4.0 - 10.5 K/uL   RBC 5.56 4.22 - 5.81 MIL/uL  Hemoglobin 15.7 13.0 - 17.0 g/dL   HCT 44.0 39.0 - 52.0 %   MCV 79.1 78.0 - 100.0 fL   MCH 28.2 26.0 - 34.0 pg   MCHC 35.7 30.0 - 36.0 g/dL   RDW 13.2 11.5 - 15.5 %   Platelets 186 150 - 400 K/uL   Neutrophils Relative % 44 %   Neutro Abs 2.4 1.7 - 7.7 K/uL   Lymphocytes Relative 44 %   Lymphs Abs 2.4 0.7 - 4.0 K/uL   Monocytes Relative 6 %   Monocytes Absolute 0.3 0.1 - 1.0 K/uL   Eosinophils Relative 6 %   Eosinophils Absolute 0.3 0.0 - 0.7 K/uL   Basophils Relative 0 %   Basophils Absolute 0.0 0.0 - 0.1 K/uL  Salicylate level     Status: None   Collection Time: 07/13/15  8:22 PM  Result Value Ref Range   Salicylate Lvl <3.8 2.8 - 30.0 mg/dL  Acetaminophen level     Status: Abnormal   Collection Time: 07/13/15  8:22 PM  Result Value Ref Range   Acetaminophen (Tylenol), Serum <10 (L) 10 - 30 ug/mL    Comment:        THERAPEUTIC CONCENTRATIONS VARY SIGNIFICANTLY. A RANGE OF 10-30 ug/mL MAY BE AN EFFECTIVE CONCENTRATION FOR MANY PATIENTS. HOWEVER, SOME ARE BEST TREATED AT CONCENTRATIONS OUTSIDE THIS RANGE. ACETAMINOPHEN CONCENTRATIONS >150 ug/mL AT 4 HOURS AFTER INGESTION AND >50 ug/mL AT 12 HOURS AFTER INGESTION ARE OFTEN ASSOCIATED WITH TOXIC REACTIONS.     Current Facility-Administered Medications  Medication Dose Route  Frequency Provider Last Rate Last Dose  . acetaminophen (TYLENOL) tablet 650 mg  650 mg Oral Q4H PRN Lorayne Bender, PA-C   650 mg at 07/13/15 2132  . alum & mag hydroxide-simeth (MAALOX/MYLANTA) 200-200-20 MG/5ML suspension 30 mL  30 mL Oral PRN Shawn C Joy, PA-C      . cloNIDine (CATAPRES) tablet 0.1 mg  0.1 mg Oral QID Lurena Nida, NP       Followed by  . [START ON 07/16/2015] cloNIDine (CATAPRES) tablet 0.1 mg  0.1 mg Oral BH-qamhs Lurena Nida, NP       Followed by  . [START ON 07/18/2015] cloNIDine (CATAPRES) tablet 0.1 mg  0.1 mg Oral QAC breakfast Lurena Nida, NP      . dicyclomine (BENTYL) tablet 20 mg  20 mg Oral Q6H PRN Lurena Nida, NP      . hydrOXYzine (ATARAX/VISTARIL) tablet 25 mg  25 mg Oral Q6H PRN Lurena Nida, NP      . loperamide (IMODIUM) capsule 2-4 mg  2-4 mg Oral PRN Lurena Nida, NP      . LORazepam (ATIVAN) tablet 0-4 mg  0-4 mg Oral Q6H Shawn C Joy, PA-C   2 mg at 07/13/15 2132   Followed by  . [START ON 07/15/2015] LORazepam (ATIVAN) tablet 0-4 mg  0-4 mg Oral Q12H Shawn C Joy, PA-C      . LORazepam (ATIVAN) tablet 1 mg  1 mg Oral Q8H PRN Shawn C Joy, PA-C      . methocarbamol (ROBAXIN) tablet 500 mg  500 mg Oral Q8H PRN Lurena Nida, NP      . nicotine (NICODERM CQ - dosed in mg/24 hours) patch 21 mg  21 mg Transdermal Daily Shawn C Joy, PA-C   21 mg at 07/13/15 2132  . ondansetron (ZOFRAN-ODT) disintegrating tablet 4 mg  4 mg Oral Q6H PRN Lurena Nida, NP      .  potassium chloride SA (K-DUR,KLOR-CON) CR tablet 40 mEq  40 mEq Oral Once Lurena Nida, NP      . zolpidem (AMBIEN) tablet 5 mg  5 mg Oral QHS PRN Shawn C Joy, PA-C       No current outpatient prescriptions on file.    Musculoskeletal: Strength & Muscle Tone: within normal limits Gait & Station: normal Patient leans: N/A  Psychiatric Specialty Exam: Physical Exam  Constitutional: He is oriented to person, place, and time. He appears well-developed and well-nourished.  HENT:  Head:  Normocephalic.  Respiratory: Effort normal.  Musculoskeletal: Normal range of motion.  Neurological: He is alert and oriented to person, place, and time.  Skin: Skin is warm and dry.    ROS  Blood pressure 129/82, pulse 70, temperature 98.2 F (36.8 C), resp. rate 20, height 6' 1" (1.854 m), weight 113.399 kg (250 lb), SpO2 100 %.Body mass index is 32.99 kg/(m^2).  General Appearance: Fairly Groomed, dressed in paper scrubs  Eye Contact:  Good  Speech:  Clear and Coherent and Normal Rate  Volume:  Normal  Mood:  Depressed  Affect:  Congruent  Thought Process:  Coherent  Orientation:  Full (Time, Place, and Person)  Thought Content:  No psychotic behavior  Suicidal Thoughts:  On admission, but none verbalized at this time  Homicidal Thoughts:  No  Memory:  Immediate;   Good Recent;   Good Remote;   Fair  Judgement:  Fair  Insight:  Fair  Psychomotor Activity:  Normal  Concentration:  Concentration: Fair and Attention Span: Fair  Recall:  Good  Fund of Knowledge:  Good  Language:  Good  Akathisia:  No  Handed:  Right  AIMS (if indicated):     Assets:  Communication Skills Desire for Improvement Housing Physical Health Resilience  ADL's:  Intact  Cognition:  WNL  Sleep:       Disposition: No evidence of imminent risk to self or others at present.   Supportive therapy provided about ongoing stressors.  -Referred to outpatient providers for substance abuse treatment -Follow up with [facility] for management of depression  -Potassium 40 meq PO x 1 here in the ED. Will discharge home with Rx for KCl.   Serena Colonel, FNP-BC [place] 07/13/2015 11:31 PM

## 2015-07-13 NOTE — ED Notes (Signed)
Patient appears to be increasing in anxiety. States "I am ready to go" Patient reminded that discharge has to be reviewed by physician due to his depression and previous statements. Patient continues to repeat himself about leaving, is asking for his clothes so he can leave.

## 2015-07-13 NOTE — ED Notes (Addendum)
Patient states is looking for help "getting off drugs"  States he takes "cocaine, pain pills...Marland Kitchen.Marland Kitchen.everthing".  Patient states that is doing cocaine and pain pills about 6 times a day.  Patient last used at 1900 yesterday.  Patient states that he "feels like my stomach is in a knot"  States he is unable to eat.  Patient states is also having back pain and stomach pain (patient had gallbladder removed x2 months ago),  and anxiety.  Patient states that people are after him and there is someone at his house right now looking for him.   [facility]  Patient states that when he was taking the ativan he was not "doing" anything else.  Patient states stopped taking ativan "months" ago due to the fact that he was unable to afford it.  Patient states pain 8/10.

## 2015-07-13 NOTE — ED Notes (Signed)
Pt has one bag of belongings contains 2 cellphone.  Pt has been seen and wanded by security.

## 2015-07-13 NOTE — ED Notes (Signed)
Patient appears cooperative. Reports poor sleep and not eating in last four days. Rates feelings of depression 10/10. Denies current SI but states "I feel like I don't want to be here anymore". Reports back and stomach pain and anxiety r/t withdraw.  Encouragement offered. Oriented to the unit. Meal provided. Environment adjusted.  Q 15 safety checks in place.

## 2015-07-13 NOTE — BH Assessment (Signed)
Tele Assessment Note   Luis Wall is an 30 y.o. male.  -Clinician reviewed note by Harolyn RutherfordShawn Joy, PA.  She said that patient presents with a history of anxiety and HTN, presenting to the ED for assistance "getting off drugs." Pt states he is addicted to cocaine and pain pills. Last cocaine was late last night around midnight and uses 2-3 grams a day. Uses percocet 10/325mg  at least 7-8 pills a day with last use around 3 AM this morning. Denies regular alcohol use. Occasional tobacco user. Pt endorses some anxiety and thinks he is withdrawing from the percocet. Pt states, "I have been thinking about killing myself. It happens more often lately." Denies having a plan. Pt adds, "There are people looking for me who want to hurt me. They are watching my house right now."  Patient does say that he has had increasing depression and anxiety.  He said that he has been having poor sleep, not getting more than about 3 hours per day.  Over the last four days he has not been eating.  Patient said that he has been having panic attacks twice daily over the last couple of weeks.  Patient has been staying in bed and isolating himself from others.  Patient says he lost his job recently.  He and wife are not staying in the same home.  They are not separated yet.  Patient is currently staying at his fathers' home.  Patient says that he feels like people don't care about him.  When asked about suicidality patient says he has been thinking a lot yesterday about killing himself.  He has no plan or intention at this time.  Patient said that the thoughts of killing himself are coming more often however.  Patient cannot currently contract for safety.  Pt denies any HI or A/V hallucinations.    Patient has been abusing crack and percocet.  When asked if he has had a hx of withdrawal seizures he says "my wife said I had one" which was in September of '16.  Pt is nervous that someone may be trying to harm him because he owes some money  to people.  Patient has no inpatient care history.  [facility] in January of this year but not since.  -Clinician discussed patient care with Alberteen SamFran Hobson, NP.  She said that labs need to result but that patient may be appropriate for OBS if beds are available.  Diagnosis: MDD single episode; Cocaine use d/o severe; Opiate use d/o severe.  Past Medical History:  Past Medical History  Diagnosis Date  . Anxiety   . Anxiety   . Hypertension     Past Surgical History  Procedure Laterality Date  . Tonsillectomy    . Wisdom tooth extraction    . Cholecystectomy N/A 04/20/2015    Procedure: LAPAROSCOPIC CHOLECYSTECTOMY ;  Surgeon: Darnell Levelodd Gerkin, MD;  Location: [facility];  Service: General;  Laterality: N/A;    Family History:  Family History  Problem Relation Age of Onset  . Hypertension Mother     Social History:  reports that he has been smoking Cigarettes.  He has been smoking about 0.50 packs per day. He has never used smokeless tobacco. He reports that he drinks alcohol. He reports that he uses illicit drugs (Cocaine).  Additional Social History:  Alcohol / Drug Use Pain Medications: Using percocet to get high Prescriptions: No prescriptions Over the Counter: N/A History of alcohol / drug use?: Yes Substance #  1 Name of Substance 1: Percocets 1 - Age of First Use: 30 years of age 8 - Amount (size/oz): 10/325mg  pills,  Averaging about 10-12 pills per day 1 - Frequency: Daily use 1 - Duration: Last three years 1 - Last Use / Amount: 06/15 around 03:00 Substance #2 Name of Substance 2: Cocaine (crack) 2 - Age of First Use: 30 years of age 50 - Amount (size/oz): 3-4 grams once or twice in a day 2 - Frequency: Daily for the last 3-4 weeks 2 - Duration: Ongoing 2 - Last Use / Amount: 06/15  CIWA: CIWA-Ar BP: 129/82 mmHg Pulse Rate: 70 Nausea and Vomiting: no nausea and no vomiting Tactile Disturbances: none Tremor: no tremor Auditory Disturbances: not  present Paroxysmal Sweats: no sweat visible Visual Disturbances: not present Anxiety: no anxiety, at ease Headache, Fullness in Head: none present Agitation: normal activity Orientation and Clouding of Sensorium: oriented and can do serial additions CIWA-Ar Total: 0 COWS: Clinical Opiate Withdrawal Scale (COWS) Resting Pulse Rate: Pulse Rate 80 or below Sweating: No report of chills or flushing Restlessness: Able to sit still Pupil Size: Pupils pinned or normal size for room light Bone or Joint Aches: Not present Runny Nose or Tearing: Not present GI Upset: Stomach cramps Tremor: No tremor Yawning: No yawning Anxiety or Irritability: None Gooseflesh Skin: Skin is smooth COWS Total Score: 1  PATIENT STRENGTHS: (choose at least two) Ability for insight Average or above average intelligence Capable of independent living Communication skills Supportive family/friends  Allergies:  Allergies  Allergen Reactions  . Flexeril [Cyclobenzaprine] Other (See Comments)    nightmares  . Ibuprofen Nausea Only  . Naproxen Nausea And Vomiting  . Tramadol Itching and Nausea Only    Home Medications:  (Not in a hospital admission)  OB/GYN Status:  No LMP for male patient.  General Assessment Data Location of Assessment: [facility] TTS Assessment: In system Is this a Tele or Face-to-Face Assessment?: Tele Assessment Is this an Initial Assessment or a Re-assessment for this encounter?: Initial Assessment Marital status: Married Is patient pregnant?: No Pregnancy Status: No Living Arrangements: Parent (Staying with his dad.) Can pt return to current living arrangement?: Yes Admission Status: Voluntary Is patient capable of signing voluntary admission?: Yes Referral Source: Self/Family/Friend Insurance type: sp     Crisis Care Plan Living Arrangements: Parent (Staying with his dad.) Name of Psychiatrist: None Name of Therapist: None  Education Status Is patient currently in  school?: No Highest grade of school patient has completed: 9th grade  Risk to self with the past 6 months Suicidal Ideation: No-Not Currently/Within Last 6 Months Has patient been a risk to self within the past 6 months prior to admission? : No Suicidal Intent: No Has patient had any suicidal intent within the past 6 months prior to admission? : No Is patient at risk for suicide?: No Suicidal Plan?: No Has patient had any suicidal plan within the past 6 months prior to admission? : No Access to Means: No What has been your use of drugs/alcohol within the last 12 months?: Pain pills and crack Previous Attempts/Gestures: No How many times?: 0 Other Self Harm Risks: None Triggers for Past Attempts: None known Intentional Self Injurious Behavior: None Family Suicide History: No Recent stressful life event(s): Conflict (Comment), Turmoil (Comment), Financial Problems, Job Loss (Pt having conflict w/ wife; lost job recently) Persecutory voices/beliefs?: Yes Depression: Yes Depression Symptoms: Despondent, Isolating, Loss of interest in usual pleasures, Feeling worthless/self pity, Guilt, Insomnia Substance abuse history  and/or treatment for substance abuse?: Yes Suicide prevention information given to non-admitted patients: Not applicable  Risk to Others within the past 6 months Homicidal Ideation: No Does patient have any lifetime risk of violence toward others beyond the six months prior to admission? : No Thoughts of Harm to Others: No Current Homicidal Intent: No Current Homicidal Plan: No Access to Homicidal Means: No Identified Victim: No one History of harm to others?: No Assessment of Violence: None Noted Violent Behavior Description: None Does patient have access to weapons?: No Criminal Charges Pending?: No Does patient have a court date: No Is patient on probation?: No  Psychosis Hallucinations: None noted Delusions: None noted  Mental Status  Report Appearance/Hygiene: Unremarkable Eye Contact: Fair Motor Activity: Freedom of movement, Unremarkable Speech: Logical/coherent Level of Consciousness: Alert Mood: Depressed, Despair, Helpless, Sad Affect: Anxious, Apprehensive, Appropriate to circumstance, Depressed Anxiety Level: Panic Attacks Panic attack frequency: Twice per day Most recent panic attack: Today Thought Processes: Coherent, Relevant Judgement: Impaired Orientation: Person, Place, Time, Situation Obsessive Compulsive Thoughts/Behaviors: None  Cognitive Functioning Concentration: Decreased Memory: Remote Intact, Recent Impaired IQ: Average Insight: Poor Impulse Control: Poor Appetite: Poor Weight Loss:  (Not eating over the last few days.) Weight Gain: 0 Sleep: Decreased Total Hours of Sleep:  (<4H/D) Vegetative Symptoms: Staying in bed  ADLScreening [facility] Assessment Services) Patient's cognitive ability adequate to safely complete daily activities?: Yes Patient able to express need for assistance with ADLs?: Yes Independently performs ADLs?: Yes (appropriate for developmental age)  Prior Inpatient Therapy Prior Inpatient Therapy: No Prior Therapy Dates: None Prior Therapy Facilty/Provider(s): N/A Reason for Treatment: N/A  Prior Outpatient Therapy Prior Outpatient Therapy: Yes Prior Therapy Dates: Around January '17 Prior Therapy Facilty/Provider(s): [facility] Reason for Treatment: med management Does patient have an ACCT team?: No Does patient have Intensive In-House Services?  : No Does patient have [facility] services? : No Does patient have P4CC services?: No  ADL Screening (condition at time of admission) Patient's cognitive ability adequate to safely complete daily activities?: Yes Is the patient deaf or have difficulty hearing?: No Does the patient have difficulty seeing, even when wearing glasses/contacts?: No Does the patient have difficulty concentrating, remembering, or making  decisions?: No Patient able to express need for assistance with ADLs?: Yes Does the patient have difficulty dressing or bathing?: No Independently performs ADLs?: Yes (appropriate for developmental age) Does the patient have difficulty walking or climbing stairs?: No Weakness of Legs: None Weakness of Arms/Hands: None       Abuse/Neglect Assessment (Assessment to be complete while patient is alone) Physical Abuse: Denies Verbal Abuse: Yes, past (Comment) (Past emotional abuse.) Sexual Abuse: Denies Exploitation of patient/patient's resources: Denies Self-Neglect: Denies     Merchant navy officer (For Healthcare) Does patient have an advance directive?: No Would patient like information on creating an advanced directive?: No - patient declined information    Additional Information 1:1 In Past 12 Months?: No CIRT Risk: No Elopement Risk: No Does patient have medical clearance?: Yes     Disposition:  Disposition Initial Assessment Completed for this Encounter: Yes Disposition of Patient: Other dispositions (Pt to be run by provider.) Other disposition(s): Other (Comment) (Pt to be run by provider)  Beatriz Stallion Ray 07/13/2015 9:01 PM

## 2015-07-13 NOTE — ED Notes (Signed)
Report called to Ed[place]ertonMegan, RN [place] [facility]

## 2015-07-13 NOTE — ED Provider Notes (Signed)
CSN: [id number]     Arrival date & time 07/13/15  1909 History   First MD Initiated Contact with Patient 07/13/15 1955     Chief Complaint  Patient presents with  . Addiction Problem     (Consider location/radiation/quality/duration/timing/severity/associated sxs/prior Treatment) HPI   Luis Wall is a 30 y.o. male, with a history of anxiety and HTN, presenting to the ED for assistance "getting off drugs." Pt states he is addicted to cocaine and pain pills. Last cocaine was late last night around midnight and uses 2-3 grams a day. Uses percocet 10/325mg  at least 7-8 pills a day with last use around 3 AM this morning. Denies regular alcohol use. Occasional tobacco user. Pt endorses some anxiety and thinks he is withdrawing from the percocet. Pt states, "I have been thinking about killing myself. It happens more often lately." Denies having a plan. Pt adds, "There are people looking for me who want to hurt me. They are watching my house right now." Denies any current acute pain or other complaints.       Past Medical History  Diagnosis Date  . Anxiety   . Anxiety   . Hypertension    Past Surgical History  Procedure Laterality Date  . Tonsillectomy    . Wisdom tooth extraction    . Cholecystectomy N/A 04/20/2015    Procedure: LAPAROSCOPIC CHOLECYSTECTOMY ;  Surgeon: Darnell Level, MD;  Location: [facility];  Service: General;  Laterality: N/A;   Family History  Problem Relation Age of Onset  . Hypertension Mother    Social History  Substance Use Topics  . Smoking status: Current Every Day Smoker -- 0.50 packs/day    Types: Cigarettes  . Smokeless tobacco: Never Used  . Alcohol Use: Yes     Comment: socially    Review of Systems  Constitutional: Negative for fever and chills.  Respiratory: Negative for shortness of breath.   Cardiovascular: Negative for chest pain.  Psychiatric/Behavioral: Positive for suicidal ideas.       Addiction  All other systems reviewed and are  negative.     Allergies  Flexeril; Ibuprofen; Naproxen; and Tramadol  Home Medications   Prior to Admission medications   Medication Sig Start Date End Date Taking? Authorizing Provider  Potassium Chloride ER 20 MEQ TBCR Take 40 mEq by mouth daily. 07/14/15   Kristeen Mans, NP   BP 129/82 mmHg  Pulse 70  Temp(Src) 98.2 F (36.8 C)  Resp 20  Ht  (1.854 m)  Wt 113.399 kg  BMI 32.99 kg/m2  SpO2 100% Physical Exam  Constitutional: He is oriented to person, place, and time. He appears well-developed and well-nourished. No distress.  HENT:  Head: Normocephalic and atraumatic.  Eyes: Conjunctivae are normal.  Neck: Neck supple.  Cardiovascular: Normal rate, regular rhythm, normal heart sounds and intact distal pulses.   Pulmonary/Chest: Effort normal and breath sounds normal. No respiratory distress.  Abdominal: Soft. There is no tenderness. There is no guarding.  Musculoskeletal: He exhibits no edema or tenderness.  Lymphadenopathy:    He has no cervical adenopathy.  Neurological: He is alert and oriented to person, place, and time.  Skin: Skin is warm and dry. He is not diaphoretic.  Psychiatric: He has a normal mood and affect. His behavior is normal.  Nursing note and vitals reviewed.   ED Course  Procedures (including critical care time) Labs Review Labs Reviewed  COMPREHENSIVE METABOLIC PANEL - Abnormal; Notable for the following:    Potassium 2.9 (*)  Glucose, Bld 109 (*)    All other components within normal limits  ACETAMINOPHEN LEVEL - Abnormal; Notable for the following:    Acetaminophen (Tylenol), Serum <10 (*)    All other components within normal limits  ETHANOL  CBC WITH DIFFERENTIAL/PLATELET  SALICYLATE LEVEL  URINE RAPID DRUG SCREEN, HOSP PERFORMED    Imaging Review No results found. I have personally reviewed and evaluated these lab results as part of my medical decision-making.   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  Patient shows no current symptoms of withdrawal. Vital signs are stable. Mention of suicidal ideations warrants TTS consult.  Patient placed in psych hold and suicide precautions taken. No home medications to order. [facility].  Filed Vitals:   07/13/15 1922 07/13/15 1934  BP: 129/82 129/82  Pulse: 70 70  Temp: 98.2 F (36.8 C)   Resp: 20   Height: 6\' 1"  (1.854 m)   Weight: 113.399 kg   SpO2: 100%      Anselm PancoastShawn C Nickholas Goldston, PA-C 07/14/15 0153  Jacalyn LefevreJulie Haviland, MD 07/17/15 [phone]

## 2015-07-13 NOTE — ED Notes (Signed)
Patient reports that he does not want to be in the hospital at present. Patient inquired about leaving and asked to speak with a provider.   Spoke with Kirby CriglerFran H, NP about patient comments.

## 2015-07-13 NOTE — ED Notes (Signed)
Kirby CriglerFran H NP in with pt.

## 2015-07-14 DIAGNOSIS — F1920 Other psychoactive substance dependence, uncomplicated: Secondary | ICD-10-CM

## 2015-07-14 MED ORDER — POTASSIUM CHLORIDE ER 20 MEQ PO TBCR: 40.0000 meq | EXTENDED_RELEASE_TABLET | Freq: Every day | ORAL | Status: DC

## 2015-07-14 MED ORDER — POTASSIUM CHLORIDE ER 20 MEQ PO TBCR: 40.0000 meq | EXTENDED_RELEASE_TABLET | Freq: Every day | ORAL | Status: AC

## 2015-07-14 NOTE — [facility] Suicide Risk Assessment (Signed)
Suicide Risk Assessment  Discharge Assessment   [facility]   Principal Problem: <principal problem not specified> Discharge Diagnoses:  Patient Active Problem List   Diagnosis Date Noted  . Cholelithiasis with chronic cholecystitis [K80.10] 04/20/2015  . Symptomatic cholelithiasis [K80.20] 04/19/2015  . 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 rate 20, height 6\' 1"  (1.854 m), weight 113.399 kg (250 lb), SpO2 100 %.Body mass index is 32.99 kg/(m^2).  General Appearance: Fairly Groomed, dressed in paper scrubs  Eye Contact:  Good  Speech:  Clear and Coherent and Normal Rate  Volume:  Normal  Mood:  Depressed  Affect:  Congruent  Thought Process:  Coherent  Orientation:  Full (Time, Place, and Person)  Thought Content:  No psychotic behavior  Suicidal Thoughts:  On admission, but none verbalized at this time  Homicidal Thoughts:  No  Memory:  Immediate;   Good Recent;   Good Remote;   Fair  Judgement:  Fair  Insight:  Fair  Psychomotor Activity:  Normal  Concentration:  Concentration: Fair and Attention Span: Fair  Recall:  Good  Fund of Knowledge:  Good  Language:  Good  Akathisia:  No  Handed:  Right  AIMS (if indicated):     Assets:  Communication Skills Desire for Improvement Housing Physical Health Resilience  ADL's:  Intact  Cognition:  WNL  Sleep:       Mental Status Per Nursing Assessment::   On Admission:     Demographic Factors:  Male, Adolescent or young adult, Unemployed and Living apart from spouse  Loss Factors: Loss of significant relationship and Financial problems/change in socioeconomic status  Historical  Factors: NA  Risk Reduction Factors:   Responsible for children under 28 years of age, Sense of responsibility to family and Living with another person, especially a relative  Continued Clinical Symptoms:  Depression:   Comorbid alcohol abuse/dependence Alcohol/Substance Abuse/Dependencies  Cognitive Features That Contribute To Risk:  Closed-mindedness    Suicide Risk:  Minimal: No identifiable suicidal ideation.  Patients presenting with no risk factors but with morbid ruminations; may be classified as minimal risk based on the severity of the depressive symptoms    Plan Of Care/Follow-up recommendations:  Activity:  As tolerated Diet:  Regular Tests:  As determined by PCP Other:  Follow up with outpatient providers as provided  **In the event of worsening symptoms, call 911, the crisis hotline or go to nearest emergency room for evaluation of symptoms.  Alberteen SamFran Plato Alspaugh, FNP-BC [facility] 07/14/2015, 12:04 AM

## 2015-09-26 ENCOUNTER — Encounter: Payer: Self-pay | Admitting: Emergency Medicine

## 2015-09-26 ENCOUNTER — Emergency Department
Admission: EM | Admit: 2015-09-26 | Discharge: 2015-09-26 | Disposition: A | Discharge status: Home / Self Care | Payer: Self-pay | Attending: Emergency Medicine | Admitting: Emergency Medicine

## 2015-09-26 DIAGNOSIS — Y9389 Activity, other specified: Secondary | ICD-10-CM | POA: Insufficient documentation

## 2015-09-26 DIAGNOSIS — X500XXA Overexertion from strenuous movement or load, initial encounter: Secondary | ICD-10-CM | POA: Insufficient documentation

## 2015-09-26 DIAGNOSIS — Y990 Civilian activity done for income or pay: Secondary | ICD-10-CM | POA: Insufficient documentation

## 2015-09-26 DIAGNOSIS — S46011A Strain of muscle(s) and tendon(s) of the rotator cuff of right shoulder, initial encounter: Secondary | ICD-10-CM | POA: Insufficient documentation

## 2015-09-26 DIAGNOSIS — Y929 Unspecified place or not applicable: Secondary | ICD-10-CM | POA: Insufficient documentation

## 2015-09-26 DIAGNOSIS — I10 Essential (primary) hypertension: Secondary | ICD-10-CM | POA: Insufficient documentation

## 2015-09-26 DIAGNOSIS — F17210 Nicotine dependence, cigarettes, uncomplicated: Secondary | ICD-10-CM | POA: Insufficient documentation

## 2015-09-26 MED ORDER — METHOCARBAMOL 750 MG PO TABS: 750.0000 mg | ORAL_TABLET | Freq: Four times a day (QID) | ORAL | 0 refills | Status: AC

## 2015-09-26 MED ORDER — METHOCARBAMOL 500 MG PO TABS
750.0000 mg | ORAL_TABLET | Freq: Once | ORAL | Status: AC
  Administered 2015-09-26: 750 mg via ORAL
  Filled 2015-09-26: qty 2

## 2015-09-26 NOTE — ED Provider Notes (Signed)
[facility] Emergency Department Provider Note ____________________________________________  Time seen: Approximately 3:08 PM  I have reviewed the triage vital signs and the nursing notes.   HISTORY  Chief Complaint Shoulder Pain    HPI Luis Wall is a 30 y.o. male who presents to the emergency department for evaluation of right shoulder pain. He states pain started 2 days ago after working with some very heavy pallets. He denies specific injury or a specific time of onset. He states he has taken Tylenol without relief.  Past Medical History:  Diagnosis Date  . Anxiety   . Anxiety   . Hypertension     Patient Active Problem List   Diagnosis Date Noted  . Cholelithiasis with chronic cholecystitis 04/20/2015  . Symptomatic cholelithiasis 04/19/2015  . 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 CHOLECYSTECTOMY N/A 04/20/2015   Procedure: LAPAROSCOPIC CHOLECYSTECTOMY ;  Surgeon: Darnell Level, MD;  Location: [facility];  Service: General;  Laterality: N/A;  . TONSILLECTOMY    . WISDOM TOOTH EXTRACTION      Prior to Admission medications   Medication Sig Start Date End Date Taking? Authorizing Provider  methocarbamol (ROBAXIN-750) 750 MG tablet Take 1 tablet (750 mg total) by mouth 4 (four) times daily. 09/26/15   Chinita Pester, FNP  Potassium Chloride ER 20 MEQ TBCR Take 40 mEq by mouth daily. 07/14/15   Kristeen Mans, NP    Allergies Flexeril [cyclobenzaprine]; Ibuprofen; Naproxen; and Tramadol  Family History  Problem Relation Age of Onset  . Hypertension Mother     Social History Social History  Substance Use Topics  . Smoking status: Current Every Day Smoker    Packs/day: 0.50    Types: Cigarettes  . Smokeless tobacco: Never Used  . Alcohol use Yes     Comment: socially    Review of Systems Constitutional: No recent  illness. Cardiovascular: Denies chest pain or palpitations. Respiratory: Denies shortness of breath. Musculoskeletal: Pain in Right shoulder Skin: Negative for rash, wound, lesion. Neurological: Negative for focal weakness or numbness.  ____________________________________________   PHYSICAL EXAM:  VITAL SIGNS: ED Triage Vitals  Enc Vitals Group     BP 09/26/15 1424 (!) 140/91     Pulse Rate 09/26/15 1424 69     Resp 09/26/15 1424 15     Temp 09/26/15 1424 98.3 F (36.8 C)     Temp Source 09/26/15 1424 Oral     SpO2 09/26/15 1424 98 %     Weight 09/26/15 1424 260 lb (117.9 kg)     Height 09/26/15 1424 6\' 1"  (1.854 m)     Head Circumference --      Peak Flow --      Pain Score 09/26/15 1443 9     Pain Loc --      Pain Edu? --      Excl. in GC? --     Constitutional: Alert and oriented. Well appearing and in no acute distress. Eyes: Conjunctivae are normal. EOMI. Head: Atraumatic. Neck: No stridor.  Respiratory: Normal respiratory effort.   Musculoskeletal: Anterior joint line tenderness of the right shoulder on palpation. No step-off deformity noted. Painful arc. Neurologic:  Normal speech and language. No gross focal neurologic deficits are appreciated. Speech is normal. No gait instability. Skin:  Skin is warm, dry and intact. Atraumatic. No lesion, wound, contusion. Psychiatric: Mood and affect are normal. Speech and behavior are normal.  ____________________________________________   LABS (all labs ordered are listed, but only abnormal results are displayed)  Labs Reviewed - No data to display ____________________________________________  RADIOLOGY  Not indicated ____________________________________________   PROCEDURES  Procedure(s) performed: None   ____________________________________________   INITIAL IMPRESSION / ASSESSMENT AND PLAN / ED COURSE  Clinical Course    Pertinent labs & imaging results that were available during my care of the  patient were reviewed by me and considered in my medical decision making (see chart for details).  Prescription for Robaxin will be given today. Patient was encouraged follow-up with orthopedics for symptoms that are not improving over the week. He is advised to return to the emergency department for symptoms that change or worsen if he is unable schedule an appointment. ____________________________________________   FINAL CLINICAL IMPRESSION(S) / ED DIAGNOSES  Final diagnoses:  Rotator cuff strain, right, initial encounter       Chinita PesterCari B Zira Helinski, FNP 09/26/15 1537    Arnaldo NatalPaul F Malinda, MD 09/26/15 [phone]

## 2015-09-26 NOTE — ED Triage Notes (Signed)
Pt states he was lifting a box yesterday and felt something pull in right shoulder. Pt is complaining of pain to shoulder at this time. Pt also complains of sore throat, sneezing and nasal drainage.

## 2015-10-31 ENCOUNTER — Encounter (HOSPITAL_COMMUNITY): Payer: Self-pay

## 2015-10-31 ENCOUNTER — Emergency Department (HOSPITAL_COMMUNITY)
Admission: EM | Admit: 2015-10-31 | Discharge: 2015-10-31 | Disposition: A | Discharge status: Home / Self Care | Payer: Self-pay | Attending: Emergency Medicine | Admitting: Emergency Medicine

## 2015-10-31 DIAGNOSIS — F17210 Nicotine dependence, cigarettes, uncomplicated: Secondary | ICD-10-CM | POA: Insufficient documentation

## 2015-10-31 DIAGNOSIS — J029 Acute pharyngitis, unspecified: Secondary | ICD-10-CM

## 2015-10-31 DIAGNOSIS — I10 Essential (primary) hypertension: Secondary | ICD-10-CM | POA: Insufficient documentation

## 2015-10-31 DIAGNOSIS — Z79899 Other long term (current) drug therapy: Secondary | ICD-10-CM | POA: Insufficient documentation

## 2015-10-31 DIAGNOSIS — B9789 Other viral agents as the cause of diseases classified elsewhere: Secondary | ICD-10-CM | POA: Insufficient documentation

## 2015-10-31 DIAGNOSIS — J028 Acute pharyngitis due to other specified organisms: Secondary | ICD-10-CM | POA: Insufficient documentation

## 2015-10-31 LAB — RAPID STREP SCREEN (MED CTR MEBANE ONLY): STREPTOCOCCUS, GROUP A SCREEN (DIRECT): NEGATIVE

## 2015-10-31 MED ORDER — BENZONATATE 100 MG PO CAPS: 100.0000 mg | ORAL_CAPSULE | Freq: Three times a day (TID) | ORAL | 0 refills | Status: AC | PRN

## 2015-10-31 MED ORDER — PREDNISONE 50 MG PO TABS: ORAL_TABLET | ORAL | 0 refills | Status: DC

## 2015-10-31 NOTE — ED Triage Notes (Signed)
Pt c/o sore throat and non-productive cough x4 days. Pt A+OX4 and afebrile.

## 2015-10-31 NOTE — Discharge Instructions (Signed)
Take tessalon as needed for cough. Take prednisone as prescribed. Recommend OTC Chloraseptic spray for throat analgesia. Drink plenty of fluids. Take Proventil or Tylenol as needed for pain. Plenty of rest. Follow-up with your primary care doctor for symptoms do not improve. Return to the ED if you experience difficulty breathing or swallowing.

## 2015-10-31 NOTE — ED Provider Notes (Signed)
[facility]-EMERGENCY DEPT Provider Note   CSN: [id number] Arrival date & time: 10/31/15  1111   By signing my name below, I, Luis Wall, attest that this documentation has been prepared under the direction and in the presence of [name], PA-C.  Electronically Signed: Octavia HeirArianna Wall, ED Scribe. 10/31/15. 1:01 PM.   History   Chief Complaint Chief Complaint  Patient presents with  . Sore Throat  . Cough   The history is provided by the patient. No language interpreter was used.   HPI Comments: Luis Wall is a 30 y.o. male who has a PMhx of HTN presents to the Emergency Department complaining of sudden onset, gradual worsening, moderate sore throat x 4 days. He notes associated dry cough and right sided rib pain secondary to cough. He expresses increased pain when swallowing. Pt has tried taking mucinex to alleviate his pain with no relief. He has not been around any sick contacts. He denies vomiting, bilateral ear pain, rhinorrhea, congestion. Pt is a smoker and smokes ~ 2-6 cigarettes a day.  Past Medical History:  Diagnosis Date  . Anxiety   . Anxiety   . Hypertension     Patient Active Problem List   Diagnosis Date Noted  . Cholelithiasis with chronic cholecystitis 04/20/2015  . Symptomatic cholelithiasis 04/19/2015  . 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 CHOLECYSTECTOMY N/A 04/20/2015   Procedure: LAPAROSCOPIC CHOLECYSTECTOMY ;  Surgeon: Darnell Levelodd Gerkin, MD;  Location: [facility];  Service: General;  Laterality: N/A;  . TONSILLECTOMY    . WISDOM TOOTH EXTRACTION         Home Medications    Prior to Admission medications   Medication Sig Start Date End Date Taking? Authorizing Provider  methocarbamol (ROBAXIN-750) 750 MG tablet Take 1 tablet (750 mg total) by mouth 4 (four) times daily. 09/26/15   Chinita Pesterari B Triplett, FNP  Potassium Chloride ER 20 MEQ TBCR Take 40  mEq by mouth daily. 07/14/15   Kristeen MansFran E Hobson, NP    Family History Family History  Problem Relation Age of Onset  . Hypertension Mother     Social History Social History  Substance Use Topics  . Smoking status: Current Every Day Smoker    Packs/day: 0.50    Types: Cigarettes  . Smokeless tobacco: Never Used  . Alcohol use Yes     Comment: socially     Allergies   Flexeril [cyclobenzaprine]; Ibuprofen; Naproxen; and Tramadol   Review of Systems Review of Systems A complete 10 system review of systems was obtained and all systems are negative except as noted in the HPI and PMH.    Physical Exam Updated Vital Signs BP 135/87 (BP Location: Left Arm)   Pulse 62   Temp 98.7 F (37.1 C) (Oral)   Resp 16   SpO2 99%   Physical Exam  Constitutional: He is oriented to person, place, and time. He appears well-developed and well-nourished. No distress.  HENT:  Head: Normocephalic and atraumatic.  Mouth/Throat: Uvula is midline, oropharynx is clear and moist and mucous membranes are normal. No trismus in the jaw. No uvula swelling. No oropharyngeal exudate, posterior oropharyngeal edema, posterior oropharyngeal erythema or tonsillar abscesses. No tonsillar exudate.  Eyes: Conjunctivae are normal. Right eye exhibits no discharge. Left eye exhibits no discharge. No scleral icterus.  Neck: Neck supple.  Cardiovascular: Normal rate, regular rhythm and normal heart sounds.  Exam reveals no gallop and no friction  rub.   No murmur heard. Pulmonary/Chest: Effort normal and breath sounds normal. No respiratory distress. He has no wheezes. He has no rales.  Lymphadenopathy:    He has no cervical adenopathy.  Neurological: He is alert and oriented to person, place, and time. Coordination normal.  Skin: Skin is warm and dry. No rash noted. He is not diaphoretic. No erythema. No pallor.  Psychiatric: He has a normal mood and affect. His behavior is normal.  Nursing note and vitals  reviewed.    ED Treatments / Results  DIAGNOSTIC STUDIES: Oxygen Saturation is 99% on RA, normal by my interpretation.  COORDINATION OF CARE:  12:52 PM Discussed treatment plan with pt at bedside and pt agreed to plan.  Labs (all labs ordered are listed, but only abnormal results are displayed) Labs Reviewed  RAPID STREP SCREEN (NOT AT [facility]  CULTURE, GROUP A STREP [facility])    EKG  EKG Interpretation None       Radiology No results found.  Procedures Procedures (including critical care time)  Medications Ordered in ED Medications - No data to display   Initial Impression / Assessment and Plan / ED Course  I have reviewed the triage vital signs and the nursing notes.  Pertinent labs & imaging results that were available during my care of the patient were reviewed by me and considered in my medical decision making (see chart for details).  Clinical Course   Pt with negative strep. Diagnosis of viral pharyngitis. No abx indicated at this time. Discussed that results of strep culture are pending and patient will be informed if positive result and abx will be called in at that time. Discharge with symptomatic tx.  No evidence of dehydration. Pt is tolerating secretions. Presentation not concerning for peritonsillar abscess or spread of infection to deep spaces of the throat; patent airway. Specific return precautions discussed. Recommended PCP follow up. Pt appears safe for discharge.   I personally performed the services described in this documentation, which was scribed in my presence. The recorded information has been reviewed and is accurate.   Final Clinical Impressions(s) / ED Diagnoses   Final diagnoses:  Viral pharyngitis    New Prescriptions Discharge Medication List as of 10/31/2015  1:06 PM    START taking these medications   Details  benzonatate (TESSALON PERLES) 100 MG capsule Take 1 capsule (100 mg total) by mouth 3 (three) times daily as needed for  cough., Starting Tue 10/31/2015, Print    predniSONE (DELTASONE) 50 MG tablet Take once daily for 3 days, Print         Dub Mikes, PA-C 10/31/15 1325    Pricilla Loveless, MD 10/31/15 1723

## 2015-10-31 NOTE — Progress Notes (Signed)
Lung sounds clear. Pt stated, "I think I have a strept throat. " Pt c/o left sided rib pain when he coughs.

## 2015-11-02 LAB — CULTURE, GROUP A STREP (THRC)

## 2016-01-24 ENCOUNTER — Encounter: Payer: Self-pay | Admitting: Emergency Medicine

## 2016-01-24 ENCOUNTER — Emergency Department
Admission: EM | Admit: 2016-01-24 | Discharge: 2016-01-24 | Disposition: A | Discharge status: Home / Self Care | Payer: Self-pay | Attending: Emergency Medicine | Admitting: Emergency Medicine

## 2016-01-24 ENCOUNTER — Emergency Department: Payer: Self-pay

## 2016-01-24 DIAGNOSIS — M545 Low back pain, unspecified: Secondary | ICD-10-CM

## 2016-01-24 DIAGNOSIS — Z79899 Other long term (current) drug therapy: Secondary | ICD-10-CM | POA: Insufficient documentation

## 2016-01-24 DIAGNOSIS — N2889 Other specified disorders of kidney and ureter: Secondary | ICD-10-CM | POA: Insufficient documentation

## 2016-01-24 DIAGNOSIS — F17210 Nicotine dependence, cigarettes, uncomplicated: Secondary | ICD-10-CM | POA: Insufficient documentation

## 2016-01-24 DIAGNOSIS — I10 Essential (primary) hypertension: Secondary | ICD-10-CM | POA: Insufficient documentation

## 2016-01-24 LAB — CBC WITH DIFFERENTIAL/PLATELET
Basophils Absolute: 0 10*3/uL (ref 0–0.1)
Basophils Relative: 1 %
EOS ABS: 0.4 10*3/uL (ref 0–0.7)
EOS PCT: 5 %
HCT: 45.1 % (ref 40.0–52.0)
HEMOGLOBIN: 15.4 g/dL (ref 13.0–18.0)
LYMPHS ABS: 4.1 10*3/uL — AB (ref 1.0–3.6)
Lymphocytes Relative: 55 %
MCH: 27.6 pg (ref 26.0–34.0)
MCHC: 34 g/dL (ref 32.0–36.0)
MCV: 81.1 fL (ref 80.0–100.0)
MONOS PCT: 6 %
Monocytes Absolute: 0.4 10*3/uL (ref 0.2–1.0)
Neutro Abs: 2.4 10*3/uL (ref 1.4–6.5)
Neutrophils Relative %: 33 %
PLATELETS: 197 10*3/uL (ref 150–440)
RBC: 5.57 MIL/uL (ref 4.40–5.90)
RDW: 13.3 % (ref 11.5–14.5)
WBC: 7.4 10*3/uL (ref 3.8–10.6)

## 2016-01-24 LAB — URINALYSIS, COMPLETE (UACMP) WITH MICROSCOPIC
Bacteria, UA: NONE SEEN
Bilirubin Urine: NEGATIVE
GLUCOSE, UA: NEGATIVE mg/dL
Hgb urine dipstick: NEGATIVE
Ketones, ur: NEGATIVE mg/dL
Leukocytes, UA: NEGATIVE
Nitrite: NEGATIVE
PROTEIN: NEGATIVE mg/dL
RBC / HPF: NONE SEEN RBC/hpf (ref 0–5)
SQUAMOUS EPITHELIAL / LPF: NONE SEEN
Specific Gravity, Urine: 1.009 (ref 1.005–1.030)
WBC UA: NONE SEEN WBC/hpf (ref 0–5)
pH: 7 (ref 5.0–8.0)

## 2016-01-24 LAB — BASIC METABOLIC PANEL
Anion gap: 6 (ref 5–15)
BUN: 9 mg/dL (ref 6–20)
CHLORIDE: 105 mmol/L (ref 101–111)
CO2: 28 mmol/L (ref 22–32)
CREATININE: 1.14 mg/dL (ref 0.61–1.24)
Calcium: 9.1 mg/dL (ref 8.9–10.3)
GFR calc Af Amer: >60 mL/min (ref >60)
GFR calc non Af Amer: >60 mL/min (ref >60)
Glucose, Bld: 110 mg/dL — ABNORMAL HIGH (ref 65–99)
Potassium: 3.3 mmol/L — ABNORMAL LOW (ref 3.5–5.1)
SODIUM: 139 mmol/L (ref 135–145)

## 2016-01-24 MED ORDER — LIDOCAINE 5 % EX PTCH: 1.0000 | MEDICATED_PATCH | CUTANEOUS | 0 refills | Status: AC

## 2016-01-24 MED ORDER — LIDOCAINE 5 % EX PTCH
1.0000 | MEDICATED_PATCH | CUTANEOUS | Status: DC
  Administered 2016-01-24: 1 via TRANSDERMAL
  Filled 2016-01-24: qty 1

## 2016-01-24 MED ORDER — SODIUM CHLORIDE 0.9 % IV BOLUS (SEPSIS)
1000.0000 mL | Freq: Once | INTRAVENOUS | Status: AC
  Administered 2016-01-24: 1000 mL via INTRAVENOUS

## 2016-01-24 NOTE — Discharge Instructions (Signed)
Use Lidoderm patch as needed for pain. Return to the ER for worsening symptoms, persistent vomiting, difficulty breathing or other concerns.

## 2016-01-24 NOTE — ED Provider Notes (Signed)
[facility] Emergency Department Provider Note   ____________________________________________   First MD Initiated Contact with Patient 01/24/16 [phone]     (approximate)  I have reviewed the triage vital signs and the nursing notes.   HISTORY  Chief Complaint Back pain   HPI Luis Wall is a 30 y.o. male who presents to the ED from home with a chief complaint of low back pain. Patient reports lower back pain "for a while". Notes increased left-sided back pain over the past 2 days which she describes as dull, waxing/waning. Symptoms associated with nausea. Denies associated fever, chills, chest pain, shortness of breath, abdominal pain, dysuria, diarrhea. Denies recent travel or trauma. Denies heavy lifting, turning, straining. Nothing makes his symptoms better or worse.   Past Medical History:  Diagnosis Date  . Anxiety   . Anxiety   . Hypertension     Patient Active Problem List   Diagnosis Date Noted  . Cholelithiasis with chronic cholecystitis 04/20/2015  . Symptomatic cholelithiasis 04/19/2015  . 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 CHOLECYSTECTOMY N/A 04/20/2015   Procedure: LAPAROSCOPIC CHOLECYSTECTOMY ;  Surgeon: Darnell Levelodd Gerkin, MD;  Location: [facility] ORS;  Service: General;  Laterality: N/A;  . TONSILLECTOMY    . WISDOM TOOTH EXTRACTION      Prior to Admission medications   Medication Sig Start Date End Date Taking? Authorizing Provider  benzonatate (TESSALON PERLES) 100 MG capsule Take 1 capsule (100 mg total) by mouth 3 (three) times daily as needed for cough. 10/31/15   Samantha Tripp Dowless, PA-C  lidocaine (LIDODERM) 5 % Place 1 patch onto the skin daily. Remove & Discard patch within 12 hours or as directed by MD 01/24/16   Irean HongJade J Sung, MD  methocarbamol (ROBAXIN-750) 750 MG tablet Take 1 tablet (750 mg total) by mouth 4 (four) times  daily. 09/26/15   Chinita Pesterari B Triplett, FNP  Potassium Chloride ER 20 MEQ TBCR Take 40 mEq by mouth daily. 07/14/15   Kristeen MansFran E Hobson, NP  predniSONE (DELTASONE) 50 MG tablet Take once daily for 3 days 10/31/15   Lester KinsmanSamantha Tripp Dowless, PA-C    Allergies Flexeril [cyclobenzaprine]; Ibuprofen; Naproxen; and Tramadol  Family History  Problem Relation Age of Onset  . Hypertension Mother   Nephrolithiasis  Social History Social History  Substance Use Topics  . Smoking status: Current Every Day Smoker    Packs/day: 0.50    Types: Cigarettes  . Smokeless tobacco: Never Used  . Alcohol use Yes     Comment: socially    Review of Systems  Constitutional: No fever/chills. Eyes: No visual changes. ENT: No sore throat. Cardiovascular: Denies chest pain. Respiratory: Denies shortness of breath. Gastrointestinal: No abdominal pain.  No nausea, no vomiting.  No diarrhea.  No constipation. Genitourinary: Negative for dysuria. Musculoskeletal: Positive for back pain. Skin: Negative for rash. Neurological: Negative for headaches, focal weakness or numbness.  10-point ROS otherwise negative.  ____________________________________________   PHYSICAL EXAM:  VITAL SIGNS: ED Triage Vitals  Enc Vitals Group     BP 01/24/16 0355 140/83     Pulse Rate 01/24/16 0355 82     Resp 01/24/16 0355 18     Temp 01/24/16 0355 97.9 F (36.6 C)     Temp Source 01/24/16 0355 Oral     SpO2 01/24/16 0355 98 %     Weight 01/24/16 0353 250 lb (113.4 kg)  Height 01/24/16 0353 6\' 1"  (1.854 m)     Head Circumference --      Peak Flow --      Pain Score 01/24/16 0354 8     Pain Loc --      Pain Edu? --      Excl. in GC? --     Constitutional: Alert and oriented. Well appearing and in mild acute distress. Eyes: Conjunctivae are normal. PERRL. EOMI. Head: Atraumatic. Nose: No congestion/rhinnorhea. Mouth/Throat: Mucous membranes are moist.  Oropharynx non-erythematous. Neck: No stridor.     Cardiovascular: Normal rate, regular rhythm. Grossly normal heart sounds.  Good peripheral circulation. Respiratory: Normal respiratory effort.  No retractions. Lungs CTAB. Gastrointestinal: Soft and nontender. No distention. No abdominal bruits. No CVA tenderness. Musculoskeletal: No midline tenderness to palpation. No lower extremity tenderness nor edema.  No joint effusions. Neurologic:  Normal speech and language. No gross focal neurologic deficits are appreciated. No gait instability. Skin:  Skin is warm, dry and intact. No rash noted. No vesicles. Psychiatric: Mood and affect are normal. Speech and behavior are normal.  ____________________________________________   LABS (all labs ordered are listed, but only abnormal results are displayed)  Labs Reviewed  CBC WITH DIFFERENTIAL/PLATELET - Abnormal; Notable for the following:       Result Value   Lymphs Abs 4.1 (*)    All other components within normal limits  BASIC METABOLIC PANEL - Abnormal; Notable for the following:    Potassium 3.3 (*)    Glucose, Bld 110 (*)    All other components within normal limits  URINALYSIS, COMPLETE (UACMP) WITH MICROSCOPIC - Abnormal; Notable for the following:    Color, Urine STRAW (*)    APPearance CLEAR (*)    All other components within normal limits   ____________________________________________  EKG  None ____________________________________________  RADIOLOGY  CT renal stone study interpreted per Dr. Manus GunningEhinger: No renal stones or obstructive uropathy. No acute abnormality. ____________________________________________   PROCEDURES  Procedure(s) performed: None  Procedures  Critical Care performed: No  ____________________________________________   INITIAL IMPRESSION / ASSESSMENT AND PLAN / ED COURSE  Pertinent labs & imaging results that were available during my care of the patient were reviewed by me and considered in my medical decision making (see chart for  details).  30 year old male with a history of polysubstance abuse who presents with low back pain, mainly left-sided. Some elements of patient's history is suggestive of kidney stone. Patient is driving; also claims allergies to NSAIDs, Flexeril and tramadol. Hesitate to administer opioid analgesic. Will obtain screening lab work and renal colic CT.  Clinical Course as of Jan 23 700  Wed Jan 24, 2016  0700 Updated patient on negative laboratory and CT imaging results. Will apply a Lidoderm patch and patient will follow-up with his PCP. Strict return precautions given. Patient verbalizes understanding and agrees with plan of care.  [JS]    Clinical Course User Index [JS] Irean HongJade J Sung, MD     ____________________________________________   FINAL CLINICAL IMPRESSION(S) / ED DIAGNOSES  Final diagnoses:  Acute left-sided low back pain without sciatica      NEW MEDICATIONS STARTED DURING THIS VISIT:  New Prescriptions   LIDOCAINE (LIDODERM) 5 %    Place 1 patch onto the skin daily. Remove & Discard patch within 12 hours or as directed by MD     Note:  This document was prepared using Dragon voice recognition software and may include unintentional dictation errors.    Irean HongJade J Sung,  MD 01/24/16 [phone]

## 2016-01-24 NOTE — ED Triage Notes (Signed)
Patient ambulatory to triage with steady gait, without difficulty or distress noted; pt reports lower back pain "for awhile" worse tonight; denies any known injury; denies any accomp symptoms

## 2016-04-24 ENCOUNTER — Emergency Department (HOSPITAL_COMMUNITY)
Admission: EM | Admit: 2016-04-24 | Discharge: 2016-04-24 | Disposition: A | Discharge status: Home / Self Care | Payer: Self-pay | Attending: Emergency Medicine | Admitting: Emergency Medicine

## 2016-04-24 ENCOUNTER — Encounter (HOSPITAL_COMMUNITY): Payer: Self-pay

## 2016-04-24 DIAGNOSIS — I10 Essential (primary) hypertension: Secondary | ICD-10-CM | POA: Insufficient documentation

## 2016-04-24 DIAGNOSIS — Z79899 Other long term (current) drug therapy: Secondary | ICD-10-CM | POA: Insufficient documentation

## 2016-04-24 DIAGNOSIS — F17210 Nicotine dependence, cigarettes, uncomplicated: Secondary | ICD-10-CM | POA: Insufficient documentation

## 2016-04-24 DIAGNOSIS — F1193 Opioid use, unspecified with withdrawal: Secondary | ICD-10-CM | POA: Insufficient documentation

## 2016-04-24 DIAGNOSIS — F1123 Opioid dependence with withdrawal: Secondary | ICD-10-CM

## 2016-04-24 LAB — COMPREHENSIVE METABOLIC PANEL
ALBUMIN: 4.3 g/dL (ref 3.5–5.0)
ALT: 47 U/L (ref 17–63)
ANION GAP: 7 (ref 5–15)
AST: 25 U/L (ref 15–41)
Alkaline Phosphatase: 48 U/L (ref 38–126)
BILIRUBIN TOTAL: 0.8 mg/dL (ref 0.3–1.2)
BUN: 11 mg/dL (ref 6–20)
CALCIUM: 9.3 mg/dL (ref 8.9–10.3)
CO2: 23 mmol/L (ref 22–32)
CREATININE: 0.87 mg/dL (ref 0.61–1.24)
Chloride: 109 mmol/L (ref 101–111)
GFR calc Af Amer: >60 mL/min (ref >60)
GLUCOSE: 94 mg/dL (ref 65–99)
Potassium: 3.8 mmol/L (ref 3.5–5.1)
Sodium: 139 mmol/L (ref 135–145)
TOTAL PROTEIN: 7.3 g/dL (ref 6.5–8.1)

## 2016-04-24 LAB — CBC WITH DIFFERENTIAL/PLATELET
BASOS ABS: 0 10*3/uL (ref 0.0–0.1)
BASOS PCT: 0 %
Eosinophils Absolute: 0.2 10*3/uL (ref 0.0–0.7)
Eosinophils Relative: 3 %
HEMATOCRIT: 46.1 % (ref 39.0–52.0)
Hemoglobin: 15.5 g/dL (ref 13.0–17.0)
Lymphocytes Relative: 48 %
Lymphs Abs: 2.8 10*3/uL (ref 0.7–4.0)
MCH: 26.5 pg (ref 26.0–34.0)
MCHC: 33.6 g/dL (ref 30.0–36.0)
MCV: 78.7 fL (ref 78.0–100.0)
MONO ABS: 0.4 10*3/uL (ref 0.1–1.0)
Monocytes Relative: 7 %
NEUTROS PCT: 42 %
Neutro Abs: 2.5 10*3/uL (ref 1.7–7.7)
PLATELETS: 254 10*3/uL (ref 150–400)
RBC: 5.86 MIL/uL — AB (ref 4.22–5.81)
RDW: 12.8 % (ref 11.5–15.5)
WBC: 5.9 10*3/uL (ref 4.0–10.5)

## 2016-04-24 LAB — RAPID URINE DRUG SCREEN, HOSP PERFORMED
Amphetamines: NOT DETECTED
BARBITURATES: NOT DETECTED
Benzodiazepines: NOT DETECTED
Cocaine: POSITIVE — AB
Opiates: POSITIVE — AB
TETRAHYDROCANNABINOL: NOT DETECTED

## 2016-04-24 LAB — SALICYLATE LEVEL: Salicylate Lvl: <7 mg/dL (ref 2.8–30.0)

## 2016-04-24 LAB — ETHANOL: Alcohol, Ethyl (B): <5 mg/dL (ref <5)

## 2016-04-24 LAB — ACETAMINOPHEN LEVEL

## 2016-04-24 MED ORDER — ONDANSETRON HCL 4 MG PO TABS: 4.0000 mg | ORAL_TABLET | Freq: Four times a day (QID) | ORAL | 0 refills | Status: AC | PRN

## 2016-04-24 MED ORDER — CLONIDINE HCL 0.1 MG PO TABS: 0.1000 mg | ORAL_TABLET | Freq: Two times a day (BID) | ORAL | 0 refills | Status: AC | PRN

## 2016-04-24 MED ORDER — CLONIDINE HCL 0.1 MG PO TABS
0.1000 mg | ORAL_TABLET | Freq: Once | ORAL | Status: AC
  Administered 2016-04-24: 0.1 mg via ORAL
  Filled 2016-04-24: qty 1

## 2016-04-24 NOTE — ED Triage Notes (Signed)
He is requesting assistance with substance abuse cessation. He states he uses IV heroin and occasionally uses cocaine. He arrives in no distress.

## 2016-04-24 NOTE — ED Notes (Signed)
TTS at bedside giving patient resources for substance abuse.

## 2016-04-24 NOTE — BH Assessment (Signed)
TTS ordered for patient @ 1240. Writer contacted EDP (Dr. Lita Mains) to discuss patient's needs for a TTS assessment. Dr. Lita Mains states that patient has a complaint of substance abuse (heroin) and needs referrals. No SI, HI, and AVH's. Dr. Lita Mains agreed to cancel the TTS order and requested this writer to provide substance abuse referral information.   The TTS consult was removed as patient only needed adequate substance abuse referral information. Writer met with patient face to face. He reported reported heroin use 2x's daily for several months. He is uninsured and lives in [place]. He sts that he is motivated for treatment and wants help. Writer provided patient with a list of local referrals. He was given direction to follow up with those referrals for continuing care of his substance abuse needs ([facility], [facility], [facility]). Patient's referrals list also provided information for local outpatient providers for group and individual counseling. Patient agreed to follow up and had no further questions for this Probation officer.

## 2016-04-24 NOTE — ED Provider Notes (Signed)
[facility]-EMERGENCY DEPT Provider Note   CSN: [id number] Arrival date & time: 04/24/16  0903     History   Chief Complaint Chief Complaint  Patient presents with  . Withdrawal    HPI Luis Wall is a 31 y.o. male.  HPI Patient presents with withdrawal symptoms. States he's had 2 days of diarrhea, diaphoresis, restlessness. Denies any nausea or vomiting. Denies hallucinations. States he has been using heroin daily for the past month. Last used yesterday morning. He also takes oxycodone regularly. States he takes 3-4 tabs daily. Last use was yesterday. Denies any alcohol use. Since he had been on Ativan but has not taken this for the past week. No SI or HI. Past Medical History:  Diagnosis Date  . Anxiety   . Anxiety   . Hypertension     Patient Active Problem List   Diagnosis Date Noted  . Cholelithiasis with chronic cholecystitis 04/20/2015  . Symptomatic cholelithiasis 04/19/2015  . 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 CHOLECYSTECTOMY N/A 04/20/2015   Procedure: LAPAROSCOPIC CHOLECYSTECTOMY ;  Surgeon: Darnell Level, MD;  Location: [facility] ORS;  Service: General;  Laterality: N/A;  . TONSILLECTOMY    . WISDOM TOOTH EXTRACTION         Home Medications    Prior to Admission medications   Medication Sig Start Date End Date Taking? Authorizing Provider  ibuprofen (ADVIL,MOTRIN) 200 MG tablet Take 800 mg by mouth every 6 (six) hours as needed for moderate pain.   Yes Historical Provider, MD  benzonatate (TESSALON PERLES) 100 MG capsule Take 1 capsule (100 mg total) by mouth 3 (three) times daily as needed for cough. Patient not taking: Reported on 04/24/2016 10/31/15   Lester Kinsman Dowless, PA-C  cloNIDine (CATAPRES) 0.1 MG tablet Take 1 tablet (0.1 mg total) by mouth 2 (two) times daily as needed (withdrawal symptoms). 04/24/16   Loren Racer, MD  lidocaine (LIDODERM) 5 %  Place 1 patch onto the skin daily. Remove & Discard patch within 12 hours or as directed by MD Patient not taking: Reported on 04/24/2016 01/24/16   Irean Hong, MD  methocarbamol (ROBAXIN-750) 750 MG tablet Take 1 tablet (750 mg total) by mouth 4 (four) times daily. Patient not taking: Reported on 04/24/2016 09/26/15   Chinita Pester, FNP  ondansetron (ZOFRAN) 4 MG tablet Take 1 tablet (4 mg total) by mouth every 6 (six) hours as needed for nausea or vomiting. 04/24/16   Loren Racer, MD  Potassium Chloride ER 20 MEQ TBCR Take 40 mEq by mouth daily. Patient not taking: Reported on 04/24/2016 07/14/15   Kristeen Mans, NP  predniSONE (DELTASONE) 50 MG tablet Take once daily for 3 days Patient not taking: Reported on 04/24/2016 10/31/15   Dub Mikes, PA-C    Family History Family History  Problem Relation Age of Onset  . Hypertension Mother     Social History Social History  Substance Use Topics  . Smoking status: Current Every Day Smoker    Packs/day: 0.50    Types: Cigarettes  . Smokeless tobacco: Never Used  . Alcohol use Yes     Comment: socially     Allergies   Flexeril [cyclobenzaprine]; Ibuprofen; Naproxen; and Tramadol   Review of Systems Review of Systems  Constitutional: Positive for chills, diaphoresis and fatigue. Negative for fever.  Respiratory: Negative for shortness of breath.   Cardiovascular: Negative for chest pain and palpitations.  Gastrointestinal: Positive for diarrhea. Negative for abdominal pain, blood in stool, nausea and vomiting.  Genitourinary: Negative for dysuria.  Musculoskeletal: Negative for back pain and neck pain.  Skin: Negative for rash.  Neurological: Negative for dizziness, tremors, weakness, light-headedness, numbness and headaches.  Psychiatric/Behavioral: Positive for sleep disturbance. Negative for hallucinations and suicidal ideas. The patient is nervous/anxious.   All other systems reviewed and are  negative.    Physical Exam Updated Vital Signs BP 120/82 (BP Location: Right Arm)   Pulse 74   Temp 98.3 F (36.8 C) (Oral)   Resp 16   Ht 6\' 1"  (1.854 m)   Wt 250 lb (113.4 kg)   SpO2 97%   BMI 32.98 kg/m   Physical Exam  Constitutional: He is oriented to person, place, and time. He appears well-developed and well-nourished. No distress.  HENT:  Head: Normocephalic and atraumatic.  Mouth/Throat: Oropharynx is clear and moist. No oropharyngeal exudate.  Eyes: EOM are normal. Pupils are equal, round, and reactive to light.  Pupils are 4 mm and reactive.  Neck: Normal range of motion. Neck supple.  Cardiovascular: Normal rate and regular rhythm.  Exam reveals no gallop and no friction rub.   No murmur heard. Pulmonary/Chest: Effort normal and breath sounds normal. No respiratory distress. He has no wheezes. He has no rales. He exhibits no tenderness.  Abdominal: Soft. Bowel sounds are normal. There is no tenderness. There is no rebound and no guarding.  Musculoskeletal: Normal range of motion. He exhibits no edema or tenderness.  Neurological: He is alert and oriented to person, place, and time.  Patient is alert and oriented x3 with clear, goal oriented speech. Patient has 5/5 motor in all extremities. Sensation is intact to light touch.   Skin: Skin is warm and dry. Capillary refill takes less than 2 seconds. No rash noted. No erythema.  Psychiatric: He has a normal mood and affect. His behavior is normal.  Does not appear to be responding to internal stimuli. Calm with no agitation. No tremor.  Nursing note and vitals reviewed.    ED Treatments / Results  Labs (all labs ordered are listed, but only abnormal results are displayed) Labs Reviewed  CBC WITH DIFFERENTIAL/PLATELET - Abnormal; Notable for the following:       Result Value   RBC 5.86 (*)    All other components within normal limits  RAPID URINE DRUG SCREEN, HOSP PERFORMED - Abnormal; Notable for the following:     Opiates POSITIVE (*)    Cocaine POSITIVE (*)    All other components within normal limits  ACETAMINOPHEN LEVEL - Abnormal; Notable for the following:    Acetaminophen (Tylenol), Serum <10 (*)    All other components within normal limits  COMPREHENSIVE METABOLIC PANEL  ETHANOL  SALICYLATE LEVEL    EKG  EKG Interpretation None       Radiology No results found.  Procedures Procedures (including critical care time)  Medications Ordered in ED Medications  cloNIDine (CATAPRES) tablet 0.1 mg (0.1 mg Oral Given 04/24/16 1033)     Initial Impression / Assessment and Plan / ED Course  I have reviewed the triage vital signs and the nursing notes.  Pertinent labs & imaging results that were available during my care of the patient were reviewed by me and considered in my medical decision making (see chart for details).     Patient is very stable appearing. Laboratory workup is normal. Given resources for outpatient opioid rehabilitation. Return precautions given.  Final  Clinical Impressions(s) / ED Diagnoses   Final diagnoses:  Narcotic withdrawal [facility](HCC)    New Prescriptions Discharge Medication List as of 04/24/2016  1:55 PM    START taking these medications   Details  cloNIDine (CATAPRES) 0.1 MG tablet Take 1 tablet (0.1 mg total) by mouth 2 (two) times daily as needed (withdrawal symptoms)., Starting Wed 04/24/2016, Print    ondansetron (ZOFRAN) 4 MG tablet Take 1 tablet (4 mg total) by mouth every 6 (six) hours as needed for nausea or vomiting., Starting Wed 04/24/2016, Print         Loren Raceravid Eschol Auxier, MD 04/24/16 1728

## 2016-05-21 ENCOUNTER — Emergency Department (HOSPITAL_COMMUNITY)
Admission: EM | Admit: 2016-05-21 | Discharge: 2016-05-21 | Disposition: A | Discharge status: Home / Self Care | Payer: Self-pay | Attending: Emergency Medicine | Admitting: Emergency Medicine

## 2016-05-21 ENCOUNTER — Emergency Department (HOSPITAL_COMMUNITY): Payer: Self-pay

## 2016-05-21 DIAGNOSIS — Y999 Unspecified external cause status: Secondary | ICD-10-CM | POA: Insufficient documentation

## 2016-05-21 DIAGNOSIS — W228XXA Striking against or struck by other objects, initial encounter: Secondary | ICD-10-CM | POA: Insufficient documentation

## 2016-05-21 DIAGNOSIS — F1110 Opioid abuse, uncomplicated: Secondary | ICD-10-CM | POA: Insufficient documentation

## 2016-05-21 DIAGNOSIS — Z79899 Other long term (current) drug therapy: Secondary | ICD-10-CM | POA: Insufficient documentation

## 2016-05-21 DIAGNOSIS — F17210 Nicotine dependence, cigarettes, uncomplicated: Secondary | ICD-10-CM | POA: Insufficient documentation

## 2016-05-21 DIAGNOSIS — Y929 Unspecified place or not applicable: Secondary | ICD-10-CM | POA: Insufficient documentation

## 2016-05-21 DIAGNOSIS — L03114 Cellulitis of left upper limb: Secondary | ICD-10-CM | POA: Insufficient documentation

## 2016-05-21 DIAGNOSIS — Y939 Activity, unspecified: Secondary | ICD-10-CM | POA: Insufficient documentation

## 2016-05-21 DIAGNOSIS — S59912A Unspecified injury of left forearm, initial encounter: Secondary | ICD-10-CM | POA: Insufficient documentation

## 2016-05-21 DIAGNOSIS — T148XXA Other injury of unspecified body region, initial encounter: Secondary | ICD-10-CM

## 2016-05-21 DIAGNOSIS — I10 Essential (primary) hypertension: Secondary | ICD-10-CM | POA: Insufficient documentation

## 2016-05-21 MED ORDER — CEPHALEXIN 500 MG PO CAPS: 500.0000 mg | ORAL_CAPSULE | Freq: Two times a day (BID) | ORAL | 0 refills | Status: DC

## 2016-05-21 NOTE — ED Triage Notes (Signed)
Pt states that he is having left arm pain distal to where he injected, pain and numbness in left 4th and 5th digits, and states he is unable to use these two fingers.

## 2016-05-21 NOTE — ED Notes (Signed)
Per EMS- Pt c/o left arm pain. Pt injects heroin, there appears to be a hard, tender small lump on arm.

## 2016-05-21 NOTE — ED Provider Notes (Signed)
[facility]-EMERGENCY DEPT Provider Note   CSN: [id number] Arrival date & time: 05/21/16  0408     History   Chief Complaint Chief Complaint  Patient presents with  . Arm Pain    HPI Sheila Gervasi is a 31 y.o. male with a past medical history of IV heroin abuse presenting today with arm pain. Patient states his entire left forearm is hurting where he injects. He has some swelling and redness. He also states he cannot move his fourth and fifth digits. This occurred 2 days ago after he punched a telephone pole. He has tenderness of the entire fourth and fifth digits. He has not taken anything for this. He has not seen a primary care provider for this. There are no further complaints. He denies any fevers chills or recent infections.  10 Systems reviewed and are negative for acute change except as noted in the HPI.   HPI  Past Medical History:  Diagnosis Date  . Anxiety   . Anxiety   . Hypertension     Patient Active Problem List   Diagnosis Date Noted  . Cholelithiasis with chronic cholecystitis 04/20/2015  . Symptomatic cholelithiasis 04/19/2015  . 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 CHOLECYSTECTOMY N/A 04/20/2015   Procedure: LAPAROSCOPIC CHOLECYSTECTOMY ;  Surgeon: Darnell Level, MD;  Location: [facility];  Service: General;  Laterality: N/A;  . TONSILLECTOMY    . WISDOM TOOTH EXTRACTION         Home Medications    Prior to Admission medications   Medication Sig Start Date End Date Taking? Authorizing Provider  ibuprofen (ADVIL,MOTRIN) 200 MG tablet Take 800 mg by mouth every 6 (six) hours as needed for moderate pain.   Yes Historical Provider, MD  benzonatate (TESSALON PERLES) 100 MG capsule Take 1 capsule (100 mg total) by mouth 3 (three) times daily as needed for cough. Patient not taking: Reported on 04/24/2016 10/31/15   Samantha Tripp Dowless, PA-C  cephALEXin  (KEFLEX) 500 MG capsule Take 1 capsule (500 mg total) by mouth 2 (two) times daily. 05/21/16   Tomasita Crumble, MD  cloNIDine (CATAPRES) 0.1 MG tablet Take 1 tablet (0.1 mg total) by mouth 2 (two) times daily as needed (withdrawal symptoms). Patient not taking: Reported on 05/21/2016 04/24/16   Loren Racer, MD  lidocaine (LIDODERM) 5 % Place 1 patch onto the skin daily. Remove & Discard patch within 12 hours or as directed by MD Patient not taking: Reported on 04/24/2016 01/24/16   Irean Hong, MD  methocarbamol (ROBAXIN-750) 750 MG tablet Take 1 tablet (750 mg total) by mouth 4 (four) times daily. Patient not taking: Reported on 04/24/2016 09/26/15   Chinita Pester, FNP  ondansetron (ZOFRAN) 4 MG tablet Take 1 tablet (4 mg total) by mouth every 6 (six) hours as needed for nausea or vomiting. Patient not taking: Reported on 05/21/2016 04/24/16   Loren Racer, MD  Potassium Chloride ER 20 MEQ TBCR Take 40 mEq by mouth daily. Patient not taking: Reported on 04/24/2016 07/14/15   Kristeen Mans, NP  predniSONE (DELTASONE) 50 MG tablet Take once daily for 3 days Patient not taking: Reported on 04/24/2016 10/31/15   Dub Mikes, PA-C    Family History Family History  Problem Relation Age of Onset  . Hypertension Mother     Social History Social History  Substance Use Topics  . Smoking status: Current Every Day Smoker  Packs/day: 0.50    Types: Cigarettes  . Smokeless tobacco: Never Used  . Alcohol use Yes     Comment: socially     Allergies   Flexeril [cyclobenzaprine]; Ibuprofen; Naproxen; and Tramadol   Review of Systems Review of Systems   Physical Exam Updated Vital Signs BP 137/82 (BP Location: Left Arm)   Pulse 85   Temp 98.2 F (36.8 C) (Oral)   Resp 18   Ht  (1.854 m)   Wt 245 lb (111.1 kg)   SpO2 97%   BMI 32.32 kg/m   Physical Exam  Constitutional: He is oriented to person, place, and time. Vital signs are normal. He appears well-developed and  well-nourished.  Non-toxic appearance. He does not appear ill. No distress.  HENT:  Head: Normocephalic and atraumatic.  Nose: Nose normal.  Mouth/Throat: Oropharynx is clear and moist. No oropharyngeal exudate.  Eyes: Conjunctivae and EOM are normal. Pupils are equal, round, and reactive to light. No scleral icterus.  Neck: Normal range of motion. Neck supple. No tracheal deviation, no edema, no erythema and normal range of motion present. No thyroid mass and no thyromegaly present.  Cardiovascular: Normal rate, regular rhythm, S1 normal, S2 normal, normal heart sounds, intact distal pulses and normal pulses.  Exam reveals no gallop and no friction rub.   No murmur heard. Pulmonary/Chest: Effort normal and breath sounds normal. No respiratory distress. He has no wheezes. He has no rhonchi. He has no rales.  Abdominal: Soft. Normal appearance and bowel sounds are normal. He exhibits no distension, no ascites and no mass. There is no hepatosplenomegaly. There is no tenderness. There is no rebound, no guarding and no CVA tenderness.  Musculoskeletal: Normal range of motion. He exhibits no edema, tenderness or deformity.  No obvious bony deformity of the fourth or fifth digits. Check marks seen in the left forearm on the ulnar side. There is redness warmth and swelling with tenderness to palpation. No crepitus.  Lymphadenopathy:    He has no cervical adenopathy.  Neurological: He is alert and oriented to person, place, and time. He has normal strength. No cranial nerve deficit or sensory deficit.  Skin: Skin is warm, dry and intact. No petechiae and no rash noted. He is not diaphoretic. No erythema. No pallor.  Nursing note and vitals reviewed.    ED Treatments / Results  Labs (all labs ordered are listed, but only abnormal results are displayed) Labs Reviewed - No data to display  EKG  EKG Interpretation None       Radiology Dg Forearm Left  Result Date: 05/21/2016 CLINICAL DATA:   31 year old male with IV joint injection in the left forearm presenting with redness and swelling. Patient punched a telephone pole and has pain over the fourth and fifth digits. EXAM: LEFT FOREARM - 2 VIEW; LEFT HAND - COMPLETE 3+ VIEW COMPARISON:  None. FINDINGS: There is no acute fracture or dislocation. The bones are well mineralized. No arthritic changes. The soft tissues appear unremarkable. No radiopaque foreign object or soft tissue gas. IMPRESSION: Negative. Electronically Signed   By: Elgie Collard M.D.   On: 05/21/2016 05:29   Dg Hand Complete Left  Result Date: 05/21/2016 CLINICAL DATA:  31 year old male with IV joint injection in the left forearm presenting with redness and swelling. Patient punched a telephone pole and has pain over the fourth and fifth digits. EXAM: LEFT FOREARM - 2 VIEW; LEFT HAND - COMPLETE 3+ VIEW COMPARISON:  None. FINDINGS: There is no acute  fracture or dislocation. The bones are well mineralized. No arthritic changes. The soft tissues appear unremarkable. No radiopaque foreign object or soft tissue gas. IMPRESSION: Negative. Electronically Signed   By: Arash  RadparvElgie Collard 05/21/2016 05:29    Procedures Procedures (including critical care time)  Medications Ordered in ED Medications - No data to display   Initial Impression / Assessment and Plan / ED Course  I have reviewed the triage vital signs and the nursing notes.  Pertinent labs & imaging results that were available during my care of the patient were reviewed by me and considered in my medical decision making (see chart for details).    Patient presents to the emergency department for left upper extremity pain. On exam he can move all 5 digits is just painful to do so. He likely has significantly present after punching the telephone pole. Will obtain x-rays of the hand and forearm. Patient will require antibiotics for left forearm cellulitis. There is no abscess formation seen. He was  counseled on ceasing IV heroin use ingood understanding of this. We'll give primary care follow-up.   5:42 AM XRs are neg.  Dc with kelfex.  He appears well and in NAD. VS remain within his normal limits and he is safe for DC.    Fninal Clinical Impressions(s) / ED Diagnoses   Final diagnoses:  Cellulitis of left upper extremity  Bruise  Heroin abuse    New Prescriptions New Prescriptions   CEPHALEXIN (KEFLEX) 500 MG CAPSULE    Take 1 capsule (500 mg total) by mouth 2 (two) times daily.     Tomasita Crumble, MD 05/21/16 [phone]

## 2017-04-30 IMAGING — DX DG FOREARM 2V*L*
2 series · 2 of 2 positions shown · non-contrast
Comparison: None.

CLINICAL DATA: 30-year-old male with IV joint injection in the left
forearm presenting with redness and swelling. Patient punched a
telephone pole and has pain over the fourth and fifth digits.

EXAM:
LEFT FOREARM - 2 VIEW; LEFT HAND - COMPLETE 3+ VIEW

[forearm ap]
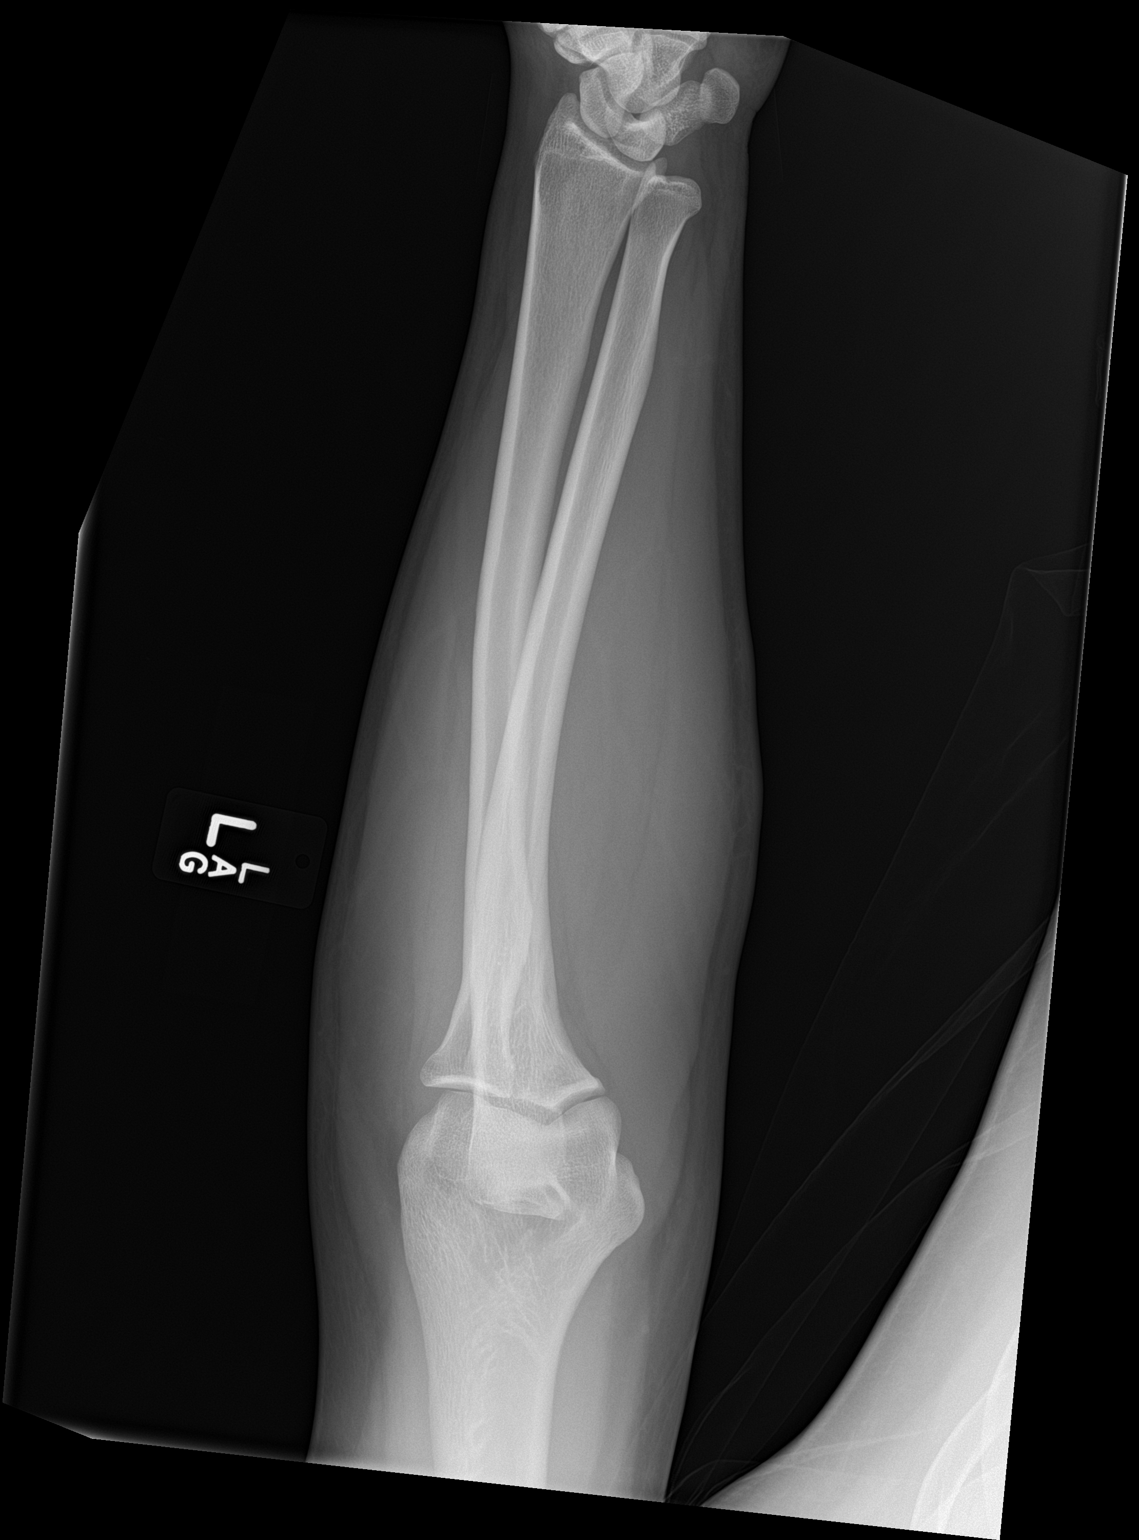

[forearm lat]
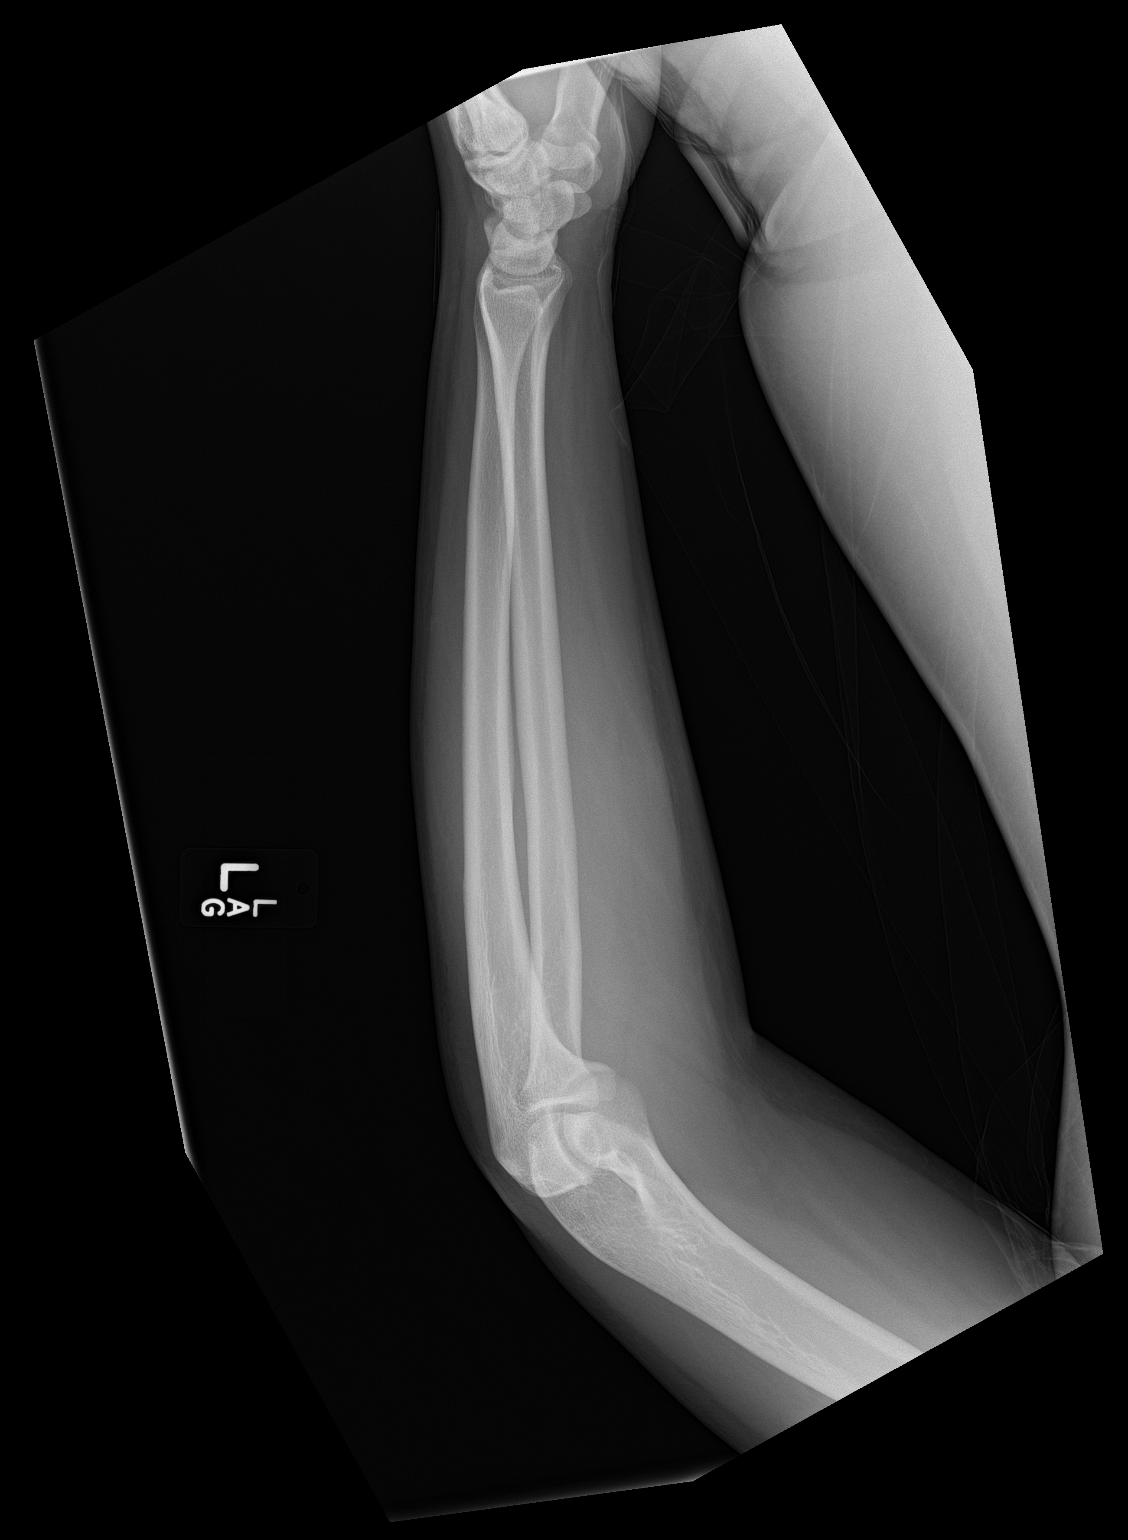

[2 of 2 positions shown; findings below may reference images not displayed]

FINDINGS: There is no acute fracture or dislocation. The bones are well
mineralized. No arthritic changes. The soft tissues appear
unremarkable. No radiopaque foreign object or soft tissue gas.
IMPRESSION: Negative.

## 2018-08-17 ENCOUNTER — Other Ambulatory Visit: Payer: Self-pay

## 2018-08-17 ENCOUNTER — Emergency Department (HOSPITAL_COMMUNITY)
Admission: EM | Admit: 2018-08-17 | Discharge: 2018-08-17 | Disposition: A | Discharge status: Home / Self Care | Payer: Self-pay | Attending: Emergency Medicine | Admitting: Emergency Medicine

## 2018-08-17 ENCOUNTER — Encounter (HOSPITAL_COMMUNITY): Payer: Self-pay | Admitting: Emergency Medicine

## 2018-08-17 DIAGNOSIS — R45851 Suicidal ideations: Secondary | ICD-10-CM | POA: Insufficient documentation

## 2018-08-17 DIAGNOSIS — F329 Major depressive disorder, single episode, unspecified: Secondary | ICD-10-CM | POA: Insufficient documentation

## 2018-08-17 DIAGNOSIS — F32A Depression, unspecified: Secondary | ICD-10-CM

## 2018-08-17 DIAGNOSIS — Z79899 Other long term (current) drug therapy: Secondary | ICD-10-CM | POA: Insufficient documentation

## 2018-08-17 DIAGNOSIS — Z20828 Contact with and (suspected) exposure to other viral communicable diseases: Secondary | ICD-10-CM | POA: Insufficient documentation

## 2018-08-17 DIAGNOSIS — Z046 Encounter for general psychiatric examination, requested by authority: Secondary | ICD-10-CM | POA: Insufficient documentation

## 2018-08-17 DIAGNOSIS — I10 Essential (primary) hypertension: Secondary | ICD-10-CM | POA: Insufficient documentation

## 2018-08-17 DIAGNOSIS — F17210 Nicotine dependence, cigarettes, uncomplicated: Secondary | ICD-10-CM | POA: Insufficient documentation

## 2018-08-17 LAB — RAPID URINE DRUG SCREEN, HOSP PERFORMED
Amphetamines: NOT DETECTED
Barbiturates: NOT DETECTED
Benzodiazepines: POSITIVE — AB
Cocaine: POSITIVE — AB
Opiates: NOT DETECTED
Tetrahydrocannabinol: POSITIVE — AB

## 2018-08-17 LAB — CBC
HCT: 46.1 % (ref 39.0–52.0)
Hemoglobin: 14.9 g/dL (ref 13.0–17.0)
MCH: 27 pg (ref 26.0–34.0)
MCHC: 32.3 g/dL (ref 30.0–36.0)
MCV: 83.5 fL (ref 80.0–100.0)
Platelets: 203 10*3/uL (ref 150–400)
RBC: 5.52 MIL/uL (ref 4.22–5.81)
RDW: 13.4 % (ref 11.5–15.5)
WBC: 7.5 10*3/uL (ref 4.0–10.5)
nRBC: 0 % (ref 0.0–0.2)

## 2018-08-17 LAB — ACETAMINOPHEN LEVEL: Acetaminophen (Tylenol), Serum: <10 ug/mL — ABNORMAL LOW (ref 10–30)

## 2018-08-17 LAB — COMPREHENSIVE METABOLIC PANEL
ALT: 39 U/L (ref 0–44)
AST: 26 U/L (ref 15–41)
Albumin: 4.2 g/dL (ref 3.5–5.0)
Alkaline Phosphatase: 41 U/L (ref 38–126)
Anion gap: 11 (ref 5–15)
BUN: 12 mg/dL (ref 6–20)
CO2: 23 mmol/L (ref 22–32)
Calcium: 9.3 mg/dL (ref 8.9–10.3)
Chloride: 107 mmol/L (ref 98–111)
Creatinine, Ser: 0.97 mg/dL (ref 0.61–1.24)
GFR calc Af Amer: >60 mL/min (ref >60)
GFR calc non Af Amer: >60 mL/min (ref >60)
Glucose, Bld: 94 mg/dL (ref 70–99)
Potassium: 3.8 mmol/L (ref 3.5–5.1)
Sodium: 141 mmol/L (ref 135–145)
Total Bilirubin: 0.4 mg/dL (ref 0.3–1.2)
Total Protein: 6.7 g/dL (ref 6.5–8.1)

## 2018-08-17 LAB — SARS CORONAVIRUS 2 BY RT PCR (HOSPITAL ORDER, PERFORMED IN ~~LOC~~ HOSPITAL LAB): SARS Coronavirus 2: NEGATIVE

## 2018-08-17 LAB — ETHANOL: Alcohol, Ethyl (B): <10 mg/dL (ref <10)

## 2018-08-17 LAB — SALICYLATE LEVEL: Salicylate Lvl: <7 mg/dL (ref 2.8–30.0)

## 2018-08-17 MED ORDER — ACETAMINOPHEN 325 MG PO TABS
650.0000 mg | ORAL_TABLET | Freq: Four times a day (QID) | ORAL | Status: DC | PRN
  Administered 2018-08-17: 650 mg via ORAL
  Filled 2018-08-17: qty 2

## 2018-08-17 NOTE — ED Triage Notes (Signed)
Pt reports having suicidal ideation for the last several days with a plan of shooting himself. Pt denies having access to firearm. Pt also reports to using cocaine to get off of pain pills.

## 2018-08-17 NOTE — [facility] Assessment (Signed)
[facility] Assessment Progress Note  Per Jacqueline Norman, DO, this pt does not require psychiatric hospitalization at this time.  Pt is to be discharged from [facility] with referral information for area substance abuse treatment providers.  This has been included in pt's discharge instructions.  Pt would also benefit from seeing Peer Support Specialists; they will be asked to speak to pt.  Pt's nurse has been notified.  Matthe Sloane, MA Triage Specialist [phone]     

## 2018-08-17 NOTE — Discharge Instructions (Signed)
To help you maintain a sober lifestyle, a substance abuse treatment program may be beneficial to you.  Contact one of the following facilities at your earliest opportunity to ask about enrolling:  RESIDENTIAL PROGRAMS:       [place], [place] [address]      [phone]       [facility]      [address] [place], [place] [address]      [phone]       [facility]      [place], [place] [address]      [phone]  OUTPATIENT PROGRAMS:       [facility]      [place], [place] [address]      [phone]      New patients must call to schedule an intake appointment Mondays, Wednesdays and Fridays, 1:00 pm - 3:00 pm

## 2018-08-17 NOTE — ED Provider Notes (Signed)
Blood pressure 120/83, pulse 63, temperature 98 F (36.7 C), temperature source Oral, resp. rate 14, height 6\' 1"  (1.854 m), weight 117.9 kg, SpO2 97 %.  In short, Luis Wall is a 33 y.o. male with a chief complaint of Suicidal .  Refer to the original H&P for additional details.  Patient presents to the Emergency department for evaluation of suicidal ideation.  He was evaluated by psychiatry this morning and is psychiatrically cleared.  They recommend outpatient substance abuse treatment which was provided in the discharge paperwork.  No active SI at discharge.    Margette Fast, MD 08/17/18 1235

## 2018-08-17 NOTE — ED Provider Notes (Signed)
[facility]-EMERGENCY DEPT Provider Note   CSN: [id number] Arrival date & time: 08/17/18  [id number]     History   Chief Complaint Chief Complaint  Patient presents with  . Suicidal    HPI Luis Wall is a 33 y.o. male.     The history is provided by the patient and medical records.     33 year old male with history of anxiety, hypertension, depression, presenting to the ED for suicidal ideation.  He reports his issues began many years ago when he was placed on pain pills after an injury.  He got hooked on opiates and then was transitioned to Suboxone.  He is recently stopped this and has started using cocaine.  He states "nothing is getting better, I just keep substituting one drug for another".  States today he started having suicidal thoughts with plan to shoot himself.  He does not have access to guns or other weapons currently.  He states "there is just so much and no one understands".  He alludes to the fact that there is been a culmination of multiple issues ongoing for quite a while now.  He does have a significant other who he has spoken to about his issues but states it does not really help him very much.  He denies any homicidal ideation.  Denies any alcohol use.  No hallucinations.  States he was previously taking Xanax, helped him on a temporary basis.  Past Medical History:  Diagnosis Date  . Anxiety   . Anxiety   . Hypertension     Patient Active Problem List   Diagnosis Date Noted  . Cholelithiasis with chronic cholecystitis 04/20/2015  . Symptomatic cholelithiasis 04/19/2015  . 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 CHOLECYSTECTOMY N/A 04/20/2015   Procedure: LAPAROSCOPIC CHOLECYSTECTOMY ;  Surgeon: Darnell Levelodd Gerkin, MD;  Location: [facility];  Service: General;  Laterality: N/A;  . TONSILLECTOMY    . WISDOM TOOTH EXTRACTION          Home  Medications    Prior to Admission medications   Medication Sig Start Date End Date Taking? Authorizing Provider  benzonatate (TESSALON PERLES) 100 MG capsule Take 1 capsule (100 mg total) by mouth 3 (three) times daily as needed for cough. Patient not taking: Reported on 04/24/2016 10/31/15   Dowless, Lelon MastSamantha Tripp, PA-C  cephALEXin (KEFLEX) 500 MG capsule Take 1 capsule (500 mg total) by mouth 2 (two) times daily. 05/21/16   Tomasita Crumbleni, Adeleke, MD  cloNIDine (CATAPRES) 0.1 MG tablet Take 1 tablet (0.1 mg total) by mouth 2 (two) times daily as needed (withdrawal symptoms). Patient not taking: Reported on 05/21/2016 04/24/16   Loren RacerYelverton, David, MD  ibuprofen (ADVIL,MOTRIN) 200 MG tablet Take 800 mg by mouth every 6 (six) hours as needed for moderate pain.    [provider]  lidocaine (LIDODERM) 5 % Place 1 patch onto the skin daily. Remove & Discard patch within 12 hours or as directed by MD Patient not taking: Reported on 04/24/2016 01/24/16   Irean HongSung, Jade J, MD  methocarbamol (ROBAXIN-750) 750 MG tablet Take 1 tablet (750 mg total) by mouth 4 (four) times daily. Patient not taking: Reported on 04/24/2016 09/26/15   Kem Boroughsriplett, Cari B, FNP  ondansetron (ZOFRAN) 4 MG tablet Take 1 tablet (4 mg total) by mouth every 6 (six) hours as needed for nausea or vomiting. Patient not taking: Reported on 05/21/2016 04/24/16   Loren RacerYelverton, David,  MD  Potassium Chloride ER 20 MEQ TBCR Take 40 mEq by mouth daily. Patient not taking: Reported on 04/24/2016 07/14/15   Lurena Nida, NP  predniSONE (DELTASONE) 50 MG tablet Take once daily for 3 days Patient not taking: Reported on 04/24/2016 10/31/15   Dowless, Dondra Spry, PA-C    Family History Family History  Problem Relation Age of Onset  . Hypertension Mother     Social History Social History   Tobacco Use  . Smoking status: Current Every Day Smoker    Packs/day: 0.50    Types: Cigarettes  . Smokeless tobacco: Never Used  Substance Use Topics  . Alcohol  use: Yes    Comment: socially  . Drug use: Yes    Types: Marijuana, IV, Cocaine    Comment: one year ago     Allergies   Flexeril [cyclobenzaprine], Ibuprofen, Naproxen, and Tramadol   Review of Systems Review of Systems  Psychiatric/Behavioral: Positive for suicidal ideas.  All other systems reviewed and are negative.    Physical Exam Updated Vital Signs BP (!) 133/97 (BP Location: Left Arm)   Pulse 71   Temp 98.4 F (36.9 C) (Oral)   Resp 18   Ht 6\' 1"  (1.854 m)   Wt 117.9 kg   SpO2 97%   BMI 34.30 kg/m   Physical Exam Vitals signs and nursing note reviewed.  Constitutional:      Appearance: He is well-developed.  HENT:     Head: Normocephalic and atraumatic.  Eyes:     Conjunctiva/sclera: Conjunctivae normal.     Pupils: Pupils are equal, round, and reactive to light.  Neck:     Musculoskeletal: Normal range of motion.  Cardiovascular:     Rate and Rhythm: Normal rate and regular rhythm.     Heart sounds: Normal heart sounds.  Pulmonary:     Effort: Pulmonary effort is normal.     Breath sounds: Normal breath sounds.  Abdominal:     General: Bowel sounds are normal.     Palpations: Abdomen is soft.  Musculoskeletal: Normal range of motion.  Skin:    General: Skin is warm and dry.  Neurological:     Mental Status: He is alert and oriented to person, place, and time.  Psychiatric:        Mood and Affect: Mood is depressed.        Thought Content: Thought content includes suicidal ideation. Thought content includes suicidal plan.     Comments: Appears depressed, SI with plan to shoot self      ED Treatments / Results  Labs (all labs ordered are listed, but only abnormal results are displayed) Labs Reviewed  ACETAMINOPHEN LEVEL - Abnormal; Notable for the following components:      Result Value   Acetaminophen (Tylenol), Serum <10 (*)    All other components within normal limits  RAPID URINE DRUG SCREEN, HOSP PERFORMED - Abnormal; Notable for  the following components:   Cocaine POSITIVE (*)    Benzodiazepines POSITIVE (*)    Tetrahydrocannabinol POSITIVE (*)    All other components within normal limits  SARS CORONAVIRUS 2 (HOSPITAL ORDER, [place] [facility])  COMPREHENSIVE METABOLIC PANEL  ETHANOL  SALICYLATE LEVEL  CBC    EKG None  Radiology No results found.  Procedures Procedures (including critical care time)  Medications Ordered in ED Medications - No data to display   Initial Impression / Assessment and Plan / ED Course  I have reviewed the triage  vital signs and the nursing notes.  Pertinent labs & imaging results that were available during my care of the patient were reviewed by me and considered in my medical decision making (see chart for details).  33 year old male presenting to the ED with suicidal thoughts.  Has history of opiate addiction, transition to Suboxone, now abusing cocaine.  He has not slept or eaten in 2 days.  He has become increasingly more depressed with new suicidal thoughts and plan to shoot himself.  He does not have access to guns or weapons.  Denies any HI or hallucinations.  Patient is afebrile and nontoxic, vitals are stable.  Screening labs are overall reassuring.  UDS is positive for cocaine, benzos, and THC.  TTS has evaluated.  Recommends inpatient placement.  [facility], will seek outside placement.  COVID screen has been obtained and is negative.  Final Clinical Impressions(s) / ED Diagnoses   Final diagnoses:  Depression, unspecified depression type    ED Discharge Orders    None       Garlon HatchetSanders, Peytan Andringa M, PA-C 08/17/18 0547    Palumbo, April, MD 08/17/18 [phone]
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
## 2018-08-17 NOTE — ED Notes (Signed)
TTS being done at this time via Telepsych machine.

## 2018-08-17 NOTE — ED Notes (Signed)
Pt asleep in room. Pt provided breakfast tray 

## 2018-08-17 NOTE — ED Notes (Signed)
Pt requesting medication for his withdrawals. Informed pt no meds ordered at this time besides PRN tylenol

## 2021-01-09 ENCOUNTER — Other Ambulatory Visit: Payer: Self-pay

## 2021-01-09 ENCOUNTER — Emergency Department
Admission: EM | Admit: 2021-01-09 | Discharge: 2021-01-09 | Disposition: A | Discharge status: Home / Self Care | Payer: Self-pay | Attending: Emergency Medicine | Admitting: Emergency Medicine

## 2021-01-09 ENCOUNTER — Encounter: Payer: Self-pay | Admitting: *Deleted

## 2021-01-09 DIAGNOSIS — F1113 Opioid abuse with withdrawal: Secondary | ICD-10-CM | POA: Insufficient documentation

## 2021-01-09 DIAGNOSIS — F1193 Opioid use, unspecified with withdrawal: Secondary | ICD-10-CM

## 2021-01-09 DIAGNOSIS — Z79899 Other long term (current) drug therapy: Secondary | ICD-10-CM | POA: Insufficient documentation

## 2021-01-09 DIAGNOSIS — F17210 Nicotine dependence, cigarettes, uncomplicated: Secondary | ICD-10-CM | POA: Insufficient documentation

## 2021-01-09 DIAGNOSIS — I10 Essential (primary) hypertension: Secondary | ICD-10-CM | POA: Insufficient documentation

## 2021-01-09 LAB — URINALYSIS, ROUTINE W REFLEX MICROSCOPIC
Bacteria, UA: NONE SEEN
Bilirubin Urine: NEGATIVE
Glucose, UA: NEGATIVE mg/dL
Hgb urine dipstick: NEGATIVE
Ketones, ur: 20 mg/dL — AB
Leukocytes,Ua: NEGATIVE
Nitrite: NEGATIVE
Protein, ur: 100 mg/dL — AB
Specific Gravity, Urine: 1.024 (ref 1.005–1.030)
pH: 5 (ref 5.0–8.0)

## 2021-01-09 LAB — COMPREHENSIVE METABOLIC PANEL
ALT: 22 U/L (ref 0–44)
AST: 22 U/L (ref 15–41)
Albumin: 3.8 g/dL (ref 3.5–5.0)
Alkaline Phosphatase: 40 U/L (ref 38–126)
Anion gap: 5 (ref 5–15)
BUN: 11 mg/dL (ref 6–20)
CO2: 23 mmol/L (ref 22–32)
Calcium: 9 mg/dL (ref 8.9–10.3)
Chloride: 107 mmol/L (ref 98–111)
Creatinine, Ser: 0.68 mg/dL (ref 0.61–1.24)
GFR, Estimated: >60 mL/min (ref >60)
Glucose, Bld: 107 mg/dL — ABNORMAL HIGH (ref 70–99)
Potassium: 4 mmol/L (ref 3.5–5.1)
Sodium: 135 mmol/L (ref 135–145)
Total Bilirubin: 0.9 mg/dL (ref 0.3–1.2)
Total Protein: 6.6 g/dL (ref 6.5–8.1)

## 2021-01-09 LAB — CBC WITH DIFFERENTIAL/PLATELET
Abs Immature Granulocytes: 0.01 10*3/uL (ref 0.00–0.07)
Basophils Absolute: 0 10*3/uL (ref 0.0–0.1)
Basophils Relative: 0 %
Eosinophils Absolute: 0.2 10*3/uL (ref 0.0–0.5)
Eosinophils Relative: 3 %
HCT: 44.6 % (ref 39.0–52.0)
Hemoglobin: 15.1 g/dL (ref 13.0–17.0)
Immature Granulocytes: 0 %
Lymphocytes Relative: 40 %
Lymphs Abs: 1.8 10*3/uL (ref 0.7–4.0)
MCH: 27.1 pg (ref 26.0–34.0)
MCHC: 33.9 g/dL (ref 30.0–36.0)
MCV: 79.9 fL — ABNORMAL LOW (ref 80.0–100.0)
Monocytes Absolute: 0.2 10*3/uL (ref 0.1–1.0)
Monocytes Relative: 5 %
Neutro Abs: 2.3 10*3/uL (ref 1.7–7.7)
Neutrophils Relative %: 52 %
Platelets: 220 10*3/uL (ref 150–400)
RBC: 5.58 MIL/uL (ref 4.22–5.81)
RDW: 12.5 % (ref 11.5–15.5)
WBC: 4.5 10*3/uL (ref 4.0–10.5)
nRBC: 0 % (ref 0.0–0.2)

## 2021-01-09 LAB — URINE DRUG SCREEN, QUALITATIVE (ARMC ONLY)
Amphetamines, Ur Screen: POSITIVE — AB
Barbiturates, Ur Screen: NOT DETECTED
Benzodiazepine, Ur Scrn: NOT DETECTED
Cannabinoid 50 Ng, Ur ~~LOC~~: POSITIVE — AB
Cocaine Metabolite,Ur ~~LOC~~: NOT DETECTED
MDMA (Ecstasy)Ur Screen: NOT DETECTED
Methadone Scn, Ur: NOT DETECTED
Opiate, Ur Screen: NOT DETECTED
Phencyclidine (PCP) Ur S: NOT DETECTED
Tricyclic, Ur Screen: NOT DETECTED

## 2021-01-09 LAB — ETHANOL: Alcohol, Ethyl (B): <10 mg/dL (ref <10)

## 2021-01-09 MED ORDER — ONDANSETRON 4 MG PO TBDP
4.0000 mg | ORAL_TABLET | Freq: Once | ORAL | Status: AC
  Administered 2021-01-09: 4 mg via ORAL
  Filled 2021-01-09: qty 1

## 2021-01-09 MED ORDER — ACETAMINOPHEN 500 MG PO TABS
1000.0000 mg | ORAL_TABLET | Freq: Once | ORAL | Status: AC
  Administered 2021-01-09: 1000 mg via ORAL
  Filled 2021-01-09: qty 2

## 2021-01-09 MED ORDER — METHADONE HCL 10 MG PO TABS
20.0000 mg | ORAL_TABLET | Freq: Once | ORAL | Status: AC
  Administered 2021-01-09: 20 mg via ORAL
  Filled 2021-01-09: qty 2

## 2021-01-09 NOTE — ED Triage Notes (Signed)
Pt states he is withdrawing from fentanyl.  Pt last used yesterday.  Pt has abd pain.  No vomiting    pt is homeless.

## 2021-01-09 NOTE — Discharge Instructions (Signed)
Return to the ER for new, worsening, or persistent severe abdominal pain, vomiting, inability to hold anything down by mouth, weakness or lightheadedness, fever, urinary symptoms, or any other new or worsening symptoms that concern you.  Follow-up at one of the outpatient substance abuse counseling facilities in the provided list of resources.

## 2021-01-09 NOTE — ED Provider Notes (Addendum)
HPI: Pt is a 35 y.o. male who presents with complaints of withdraw from fentanyl  The patient p/w  withdraw from fentanyl last used Monday morning- using IN. Homeless but lives with friends.  Diffuse pain and nausea.  No SI   ROS: Denies fever, chest pain, vomiting  Past Medical History:  Diagnosis Date   Anxiety    Anxiety    Hypertension    There were no vitals filed for this visit.  Focused Physical Exam: Gen: No acute distress Head: atraumatic, normocephalic Eyes: Extraocular movements grossly intact; conjunctiva clear CV: RRR Lung: No increased WOB, no stridor GI: ND, no obvious masses Neuro: Alert and awake  Medical Decision Making and Plan: Given the patient's initial medical screening exam, the following diagnostic evaluation has been ordered. The patient will be placed in the appropriate treatment space, once one is available, to complete the evaluation and treatment. I have discussed the plan of care with the patient and I have advised the patient that an ED physician or mid-level practitioner will reevaluate their condition after the test results have been received, as the results may give them additional insight into the type of treatment they may need.   Diagnostics: labs   Treatments: none immediately   Concha Se, MD 01/09/21 3748    Concha Se, MD 01/09/21 [phone]

## 2021-01-09 NOTE — ED Provider Notes (Signed)
[facility] Emergency Department Provider Note ____________________________________________   Event Date/Time   First MD Initiated Contact with Patient 01/09/21 [phone]     (approximate)  I have reviewed the triage vital signs and the nursing notes.   HISTORY  Chief Complaint Withdrawal    HPI Luis Wall is a 35 y.o. male with PMH as noted below including polysubstance abuse and anxiety who presents reporting symptoms of opiate withdrawal, gradual onset since last night, and mainly consisting of crampy abdominal pain, nausea, and anxiety.  He denies fevers but has had some chills.  He has had no vomiting or diarrhea.  He states he last used fentanyl about 24 hours ago.  He denies other drug use.  He denies any SI or HI or any other acute mental health complaints.  He is currently homeless but is living with friends.  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 Authorizing Provider  benzonatate (TESSALON PERLES) 100 MG capsule Take 1 capsule (100 mg total) by mouth 3 (three) times daily as needed for cough. Patient not taking: Reported on 04/24/2016 10/31/15   Dowless, Lelon Mast Tripp, PA-C  cloNIDine (CATAPRES) 0.1 MG tablet Take 1 tablet (0.1 mg total) by mouth 2 (two) times daily as needed (withdrawal symptoms). Patient not taking: Reported on  05/21/2016 04/24/16   Loren Racer, MD  ibuprofen (ADVIL,MOTRIN) 200 MG tablet Take 800 mg by mouth every 6 (six) hours as needed for moderate pain.    [provider]  lidocaine (LIDODERM) 5 % Place 1 patch onto the skin daily. Remove & Discard patch within 12 hours or as directed by MD Patient not taking: Reported on 04/24/2016 01/24/16   Irean Hong, MD  methocarbamol (ROBAXIN-750) 750 MG tablet Take 1 tablet (750 mg total) by mouth 4 (four) times daily. Patient not taking: Reported on 04/24/2016 09/26/15   Kem Boroughs B, FNP  ondansetron (ZOFRAN) 4 MG tablet Take 1 tablet (4 mg total) by mouth every 6 (six) hours as needed for nausea or vomiting. Patient not taking: Reported on 05/21/2016 04/24/16   Loren Racer, MD  Potassium Chloride ER 20 MEQ TBCR Take 40 mEq by mouth daily. Patient not taking: Reported on 04/24/2016 07/14/15   Kristeen Mans, NP    Allergies Flexeril [cyclobenzaprine], Ibuprofen, Naproxen, and Tramadol  Family History  Problem Relation Age of Onset   Hypertension Mother     Social History Social History   Tobacco Use   Smoking status: Every Day    Packs/day: 0.50    Types: Cigarettes   Smokeless tobacco: Never  Substance Use Topics   Alcohol use: Not Currently    Comment: socially   Drug use: Yes    Types: Marijuana, IV, Cocaine    Comment: one year ago    Review of Systems  Constitutional: No fever.  Positive for chills.  Eyes: No redness. ENT: No sore throat. Cardiovascular: Denies chest pain. Respiratory: Denies shortness of breath. Gastrointestinal: Positive for nausea. Genitourinary: Negative for dysuria.  Musculoskeletal: Negative for back pain. Skin: Negative for rash. Neurological: Negative for headaches, focal weakness or numbness.   ____________________________________________   PHYSICAL EXAM:  VITAL SIGNS: ED Triage Vitals  Enc Vitals Group     BP 01/09/21 0234 (!) 119/92     Pulse Rate 01/09/21 0234 69      Resp 01/09/21 0234 18     Temp 01/09/21 0234 97.8 F (36.6 C)     Temp Source 01/09/21 0234 Oral     SpO2 01/09/21 0234 97 %     Weight 01/09/21 0235 203 lb (92.1 kg)     Height 01/09/21 0235 6' (1.829 m)     Head Circumference --      Peak Flow --      Pain Score 01/09/21 0235 10     Pain Loc --      Pain Edu? --      Excl. in GC? --     Constitutional: Alert and oriented. Well appearing and in no acute distress. Eyes: Conjunctivae are normal.  Head: Atraumatic. Nose: No congestion/rhinnorhea. Mouth/Throat: Mucous membranes are moist.   Neck: Normal range of motion.  Cardiovascular: Normal rate, regular rhythm. Good peripheral circulation. Respiratory: Normal respiratory effort.  No retractions.  Gastrointestinal: Soft and nontender. No distention.  Genitourinary: No flank tenderness. Musculoskeletal: Extremities warm and well perfused.  Neurologic:  Normal speech and language. No gross focal neurologic deficits are appreciated.  Skin:  Skin is warm and dry. No rash noted. Psychiatric: Mood and affect are normal. Speech and behavior are normal.  ____________________________________________   LABS (all labs ordered are listed, but only abnormal results are displayed)  Labs Reviewed  CBC WITH DIFFERENTIAL/PLATELET - Abnormal; Notable for the following components:      Result Value   MCV 79.9 (*)    All other components within normal limits  COMPREHENSIVE METABOLIC PANEL - Abnormal; Notable for the following components:   Glucose, Bld 107 (*)    All other components within normal limits  URINALYSIS, ROUTINE W REFLEX MICROSCOPIC - Abnormal; Notable for the following components:   Color, Urine AMBER (*)    APPearance CLOUDY (*)    Ketones, ur 20 (*)    Protein, ur 100 (*)    All other components within normal limits  URINE DRUG SCREEN, QUALITATIVE ([facility] ONLY) - Abnormal; Notable for the following components:   Amphetamines, Ur Screen POSITIVE (*)    Cannabinoid 50 Ng,  Ur [place] POSITIVE (*)    All other components within normal limits  ETHANOL   ____________________________________________  EKG   ____________________________________________  RADIOLOGY    ____________________________________________   PROCEDURES  Procedure(s) performed: No  Procedures  Critical Care performed: No ____________________________________________   INITIAL IMPRESSION / ASSESSMENT AND PLAN / ED COURSE  Pertinent labs & imaging results that were available during my care of the patient were reviewed by me and considered in my medical decision making (see chart for details).   35 year old male with PMH as noted above presents stating he is in opiate withdrawal after last using fentanyl yesterday morning.  He denies other drug use and denies any acute mental health complaints.  On exam he is overall well-appearing and the vital signs are normal.  Physical exam is unremarkable.  Abdomen is soft and nontender.  Lab work-up obtained from triage is unremarkable.  Urinalysis does  show some RBCs and WBCs but no bacteria and the patient denies any dysuria or other urinary symptoms.  UDS is positive for amphetamines and cannabinoids.  Presentation is consistent with mild opiate withdrawal.  We will give Tylenol and Zofran for symptomatic treatment, a low dose of methadone, and give referral for outpatient substance abuse treatment.  ----------------------------------------- 10:11 AM on 01/09/2021 -----------------------------------------  Patient is stable for discharge home.  We have provided a list of outpatient counseling resources.  Return precautions given, and he expresses understanding.  ____________________________________________   FINAL CLINICAL IMPRESSION(S) / ED DIAGNOSES  Final diagnoses:  Opiate withdrawal (HCC)      NEW MEDICATIONS STARTED DURING THIS VISIT:  New Prescriptions   No medications on file     Note:  This document was prepared  using Dragon voice recognition software and may include unintentional dictation errors.    Dionne Bucy, MD 01/09/21 1012

## 2021-01-09 NOTE — ED Notes (Signed)
Meal tray tolerated. No complaints. No signs of distress.

## 2021-06-17 ENCOUNTER — Emergency Department: Payer: Self-pay

## 2021-06-17 ENCOUNTER — Emergency Department
Admission: EM | Admit: 2021-06-17 | Discharge: 2021-06-17 | Disposition: A | Discharge status: Home / Self Care | Payer: Self-pay | Attending: Emergency Medicine | Admitting: Emergency Medicine

## 2021-06-17 DIAGNOSIS — M79673 Pain in unspecified foot: Secondary | ICD-10-CM | POA: Diagnosis present

## 2021-06-17 DIAGNOSIS — Y92410 Unspecified street and highway as the place of occurrence of the external cause: Secondary | ICD-10-CM | POA: Insufficient documentation

## 2021-06-17 DIAGNOSIS — M79671 Pain in right foot: Secondary | ICD-10-CM | POA: Insufficient documentation

## 2021-06-17 DIAGNOSIS — K029 Dental caries, unspecified: Secondary | ICD-10-CM | POA: Insufficient documentation

## 2021-06-17 DIAGNOSIS — K0889 Other specified disorders of teeth and supporting structures: Secondary | ICD-10-CM

## 2021-06-17 MED ORDER — CHLORHEXIDINE GLUCONATE 0.12 % MT SOLN: 15.0000 mL | Freq: Two times a day (BID) | OROMUCOSAL | 0 refills | Status: AC

## 2021-06-17 MED ORDER — ACETAMINOPHEN 325 MG PO TABS: 650.0000 mg | ORAL_TABLET | Freq: Once | ORAL | Status: DC

## 2021-06-17 MED ORDER — AMOXICILLIN-POT CLAVULANATE 875-125 MG PO TABS: 1.0000 | ORAL_TABLET | Freq: Two times a day (BID) | ORAL | 0 refills | Status: AC

## 2021-06-17 NOTE — Discharge Instructions (Addendum)
Your x-ray is negative.  Rest, ice, elevate your foot.  You have also been given antibiotics for your dental pain.  Please follow-up with a dentist.  Please return for any new, worsening, or change in symptoms or other concerns.
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
## 2022-01-08 ENCOUNTER — Ambulatory Visit: Payer: Medicaid Other | Admitting: Internal Medicine

## 2022-01-16 ENCOUNTER — Ambulatory Visit: Payer: Medicaid Other | Admitting: Internal Medicine
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
## 2023-12-09 ENCOUNTER — Other Ambulatory Visit: Payer: Self-pay

## 2023-12-09 ENCOUNTER — Emergency Department (HOSPITAL_COMMUNITY)
Admission: EM | Admit: 2023-12-09 | Discharge: 2023-12-09 | Attending: Emergency Medicine | Admitting: Emergency Medicine

## 2023-12-09 DIAGNOSIS — F1510 Other stimulant abuse, uncomplicated: Secondary | ICD-10-CM | POA: Diagnosis present

## 2023-12-09 DIAGNOSIS — F1910 Other psychoactive substance abuse, uncomplicated: Secondary | ICD-10-CM | POA: Insufficient documentation

## 2023-12-09 LAB — URINE DRUG SCREEN
Amphetamines: POSITIVE — AB
Barbiturates: NEGATIVE
Benzodiazepines: NEGATIVE
Cocaine: NEGATIVE
Fentanyl: POSITIVE — AB
Methadone Scn, Ur: NEGATIVE
Opiates: NEGATIVE
Tetrahydrocannabinol: NEGATIVE

## 2023-12-09 LAB — ETHANOL: Alcohol, Ethyl (B): <15 mg/dL (ref <15)

## 2023-12-09 LAB — CBC WITH DIFFERENTIAL/PLATELET
Abs Immature Granulocytes: 0.05 K/uL (ref 0.00–0.07)
Basophils Absolute: 0 K/uL (ref 0.0–0.1)
Basophils Relative: 0 %
Eosinophils Absolute: 0.1 K/uL (ref 0.0–0.5)
Eosinophils Relative: 1 %
HCT: 41.3 % (ref 39.0–52.0)
Hemoglobin: 13.5 g/dL (ref 13.0–17.0)
Immature Granulocytes: 1 %
Lymphocytes Relative: 15 %
Lymphs Abs: 1.2 K/uL (ref 0.7–4.0)
MCH: 26.3 pg (ref 26.0–34.0)
MCHC: 32.7 g/dL (ref 30.0–36.0)
MCV: 80.5 fL (ref 80.0–100.0)
Monocytes Absolute: 0.6 K/uL (ref 0.1–1.0)
Monocytes Relative: 8 %
Neutro Abs: 5.9 K/uL (ref 1.7–7.7)
Neutrophils Relative %: 75 %
Platelets: 319 K/uL (ref 150–400)
RBC: 5.13 MIL/uL (ref 4.22–5.81)
RDW: 12.5 % (ref 11.5–15.5)
WBC: 7.8 K/uL (ref 4.0–10.5)
nRBC: 0 % (ref 0.0–0.2)

## 2023-12-09 LAB — BASIC METABOLIC PANEL WITH GFR
Anion gap: 12 (ref 5–15)
BUN: 15 mg/dL (ref 6–20)
CO2: 23 mmol/L (ref 22–32)
Calcium: 9.7 mg/dL (ref 8.9–10.3)
Chloride: 104 mmol/L (ref 98–111)
Creatinine, Ser: 0.8 mg/dL (ref 0.61–1.24)
GFR, Estimated: >60 mL/min (ref >60)
Glucose, Bld: 95 mg/dL (ref 70–99)
Potassium: 3.9 mmol/L (ref 3.5–5.1)
Sodium: 139 mmol/L (ref 135–145)

## 2023-12-09 MED ORDER — ONDANSETRON 4 MG PO TBDP
4.0000 mg | ORAL_TABLET | Freq: Once | ORAL | Status: AC
  Administered 2023-12-09: 4 mg via ORAL
  Filled 2023-12-09: qty 1

## 2023-12-09 NOTE — ED Triage Notes (Signed)
 Pt arrives via PD. Pt states that he is withdrawing from heroin and fentanyl . Last use was approx 8 hours ago. Pt states that he typically smokes. Pt also endorses daily alcohol use, with a hx of withdrawal. Plast alcohol approx 8 hours ago.

## 2023-12-09 NOTE — ED Provider Notes (Signed)
 [place] EMERGENCY DEPARTMENT AT [facility] Provider Note   CSN: [id number] Arrival date & time: 12/09/23  9041     Patient presents with: Drug Problem   Luis Wall is a 38 y.o. male.   38 year old male with prior medical history as detailed below presents for evaluation.  Patient's last use of heroin and fentanyl  was early this morning around midnight.  He also reports daily alcohol use.  His last drink was sometime around midnight as well.  He is currently in police custody.  He is being arrested.  Patient reports mild symptoms of withdrawal.  He reports some nausea.  He reports pain all over.  He has not thrown up.  He does not have tremors.  He denies prior history of seizure.  His last period of sobriety was approximately 6 months ago per his report.  The history is provided by the patient and medical records.       Prior to Admission medications   Medication Sig Start Date End Date Taking? Authorizing Provider  amoxicillin -clavulanate (AUGMENTIN ) 875-125 MG tablet Take 1 tablet by mouth 2 (two) times daily. 06/17/21   Poggi, Jenna E, PA-C  benzonatate  (TESSALON  PERLES) 100 MG capsule Take 1 capsule (100 mg total) by mouth 3 (three) times daily as needed for cough. Patient not taking: Reported on 04/24/2016 10/31/15   Dowless, Lucie Tripp, PA-C  chlorhexidine  (PERIDEX ) 0.12 % solution Use as directed 15 mLs in the mouth or throat 2 (two) times daily. 06/17/21   Poggi, Jenna E, PA-C  cloNIDine  (CATAPRES ) 0.1 MG tablet Take 1 tablet (0.1 mg total) by mouth 2 (two) times daily as needed (withdrawal symptoms). Patient not taking: Reported on 05/21/2016 04/24/16   Carlyle Lenis, MD  ibuprofen  (ADVIL ,MOTRIN ) 200 MG tablet Take 800 mg by mouth every 6 (six) hours as needed for moderate pain.    [provider]  lidocaine  (LIDODERM ) 5 % Place 1 patch onto the skin daily. Remove & Discard patch within 12 hours or as directed by MD Patient not taking: Reported  on 04/24/2016 01/24/16   Sung, Jade J, MD  methocarbamol  (ROBAXIN -750) 750 MG tablet Take 1 tablet (750 mg total) by mouth 4 (four) times daily. Patient not taking: Reported on 04/24/2016 09/26/15   Herlinda Belton B, FNP  ondansetron  (ZOFRAN ) 4 MG tablet Take 1 tablet (4 mg total) by mouth every 6 (six) hours as needed for nausea or vomiting. Patient not taking: Reported on 05/21/2016 04/24/16   Carlyle Lenis, MD  Potassium Chloride  ER 20 MEQ TBCR Take 40 mEq by mouth daily. Patient not taking: Reported on 04/24/2016 07/14/15   Cathryne Sherrell BRAVO, NP    Allergies: Flexeril  [cyclobenzaprine ], Ibuprofen , Naproxen , and Tramadol    Review of Systems  All other systems reviewed and are negative.   Updated Vital Signs BP 122/75 (BP Location: Right Arm)   Pulse 82   Temp 98.4 F (36.9 C) (Oral)   Resp 18   SpO2 99%   Physical Exam Vitals and nursing note reviewed.  Constitutional:      General: He is not in acute distress.    Appearance: He is well-developed.  HENT:     Head: Normocephalic and atraumatic.  Eyes:     Conjunctiva/sclera: Conjunctivae normal.  Cardiovascular:     Rate and Rhythm: Normal rate and regular rhythm.     Heart sounds: No murmur heard. Pulmonary:     Effort: Pulmonary effort is normal. No respiratory distress.     Breath  sounds: Normal breath sounds.  Abdominal:     Palpations: Abdomen is soft.     Tenderness: There is no abdominal tenderness.  Musculoskeletal:        General: No swelling.     Cervical back: Neck supple.  Skin:    General: Skin is warm and dry.     Capillary Refill: Capillary refill takes less than 2 seconds.  Neurological:     Mental Status: He is alert.  Psychiatric:        Mood and Affect: Mood normal.     (all labs ordered are listed, but only abnormal results are displayed) Labs Reviewed  BASIC METABOLIC PANEL WITH GFR  CBC WITH DIFFERENTIAL/PLATELET  ETHANOL  URINE DRUG SCREEN    EKG: None  Radiology: No results  found.   Procedures   Medications Ordered in the ED  ondansetron  (ZOFRAN -ODT) disintegrating tablet 4 mg (has no administration in time range)                                    Medical Decision Making Patient is being arrested by PD.  He complained to law enforcement that he was having withdrawal from heroin.  He was therefore brought to the ED.  In the ED complains of mild diffuse achy pain.  He also complains of some mild nausea.  Otherwise he has no complaints.  His vitals are normal.  His COWS score is 1. CIWA score is 1.  He does not appear to be experiencing significant withdrawal symptoms either from heroin or alcohol.  Labs obtained are reassuring.  Patient is appropriate to be released into custody of law enforcement. Outpatient resources provided.   Amount and/or Complexity of Data Reviewed Labs: ordered.  Risk Prescription drug management.        Final diagnoses:  Polysubstance abuse [facility])    ED Discharge Orders     None          Laurice Maude BROCKS, MD 12/09/23 1146

## 2023-12-09 NOTE — Discharge Instructions (Signed)
 Return for any problem.  ?
